# Patient Record
Sex: Male | Born: 1935 | Race: White | Hispanic: No | Marital: Married | Smoking: Former smoker
Health system: Southern US, Community
[De-identification: ages and names within clinical notes are randomized; demographics above are authoritative.]

## PROBLEM LIST (undated history)

## (undated) DIAGNOSIS — E785 Hyperlipidemia, unspecified: Secondary | ICD-10-CM

## (undated) DIAGNOSIS — IMO0002 Reserved for concepts with insufficient information to code with codable children: Secondary | ICD-10-CM

## (undated) DIAGNOSIS — I2510 Atherosclerotic heart disease of native coronary artery without angina pectoris: Secondary | ICD-10-CM

## (undated) DIAGNOSIS — I10 Essential (primary) hypertension: Secondary | ICD-10-CM

## (undated) HISTORY — PX: REPLACEMENT TOTAL KNEE: SUR1224

## (undated) HISTORY — PX: CARDIAC CATHETERIZATION: SHX172

## (undated) HISTORY — DX: Reserved for concepts with insufficient information to code with codable children: IMO0002

## (undated) HISTORY — DX: Essential (primary) hypertension: I10

---

## 2012-08-11 ENCOUNTER — Ambulatory Visit: Payer: Self-pay | Admitting: General Practice

## 2012-08-11 LAB — BASIC METABOLIC PANEL
Anion Gap: 7 (ref 7–16)
BUN: 8 mg/dL (ref 7–18)
Calcium, Total: 8.9 mg/dL (ref 8.5–10.1)
Chloride: 102 mmol/L (ref 98–107)
Co2: 30 mmol/L (ref 21–32)
Creatinine: 0.99 mg/dL (ref 0.60–1.30)
EGFR (African American): >60
Glucose: 104 mg/dL — ABNORMAL HIGH (ref 65–99)
Osmolality: 276 (ref 275–301)

## 2012-08-11 LAB — URINALYSIS, COMPLETE
Bilirubin,UR: NEGATIVE
Blood: NEGATIVE
Ketone: NEGATIVE
Leukocyte Esterase: NEGATIVE
Nitrite: NEGATIVE
RBC,UR: 1 /HPF (ref 0–5)
Specific Gravity: 1.019 (ref 1.003–1.030)
Squamous Epithelial: NONE SEEN

## 2012-08-11 LAB — CBC
MCH: 30.4 pg (ref 26.0–34.0)
MCHC: 34.7 g/dL (ref 32.0–36.0)
Platelet: 232 10*3/uL (ref 150–440)
WBC: 10.7 10*3/uL — ABNORMAL HIGH (ref 3.8–10.6)

## 2012-08-11 LAB — MRSA PCR SCREENING

## 2012-08-11 LAB — PROTIME-INR: Prothrombin Time: 13.9 secs (ref 11.5–14.7)

## 2012-08-12 LAB — URINE CULTURE

## 2012-08-25 ENCOUNTER — Inpatient Hospital Stay: Payer: Self-pay | Admitting: General Practice

## 2012-08-26 LAB — BASIC METABOLIC PANEL
Calcium, Total: 8.1 mg/dL — ABNORMAL LOW (ref 8.5–10.1)
Chloride: 100 mmol/L (ref 98–107)
Co2: 28 mmol/L (ref 21–32)
EGFR (African American): >60
Glucose: 115 mg/dL — ABNORMAL HIGH (ref 65–99)
Potassium: 3.2 mmol/L — ABNORMAL LOW (ref 3.5–5.1)
Sodium: 134 mmol/L — ABNORMAL LOW (ref 136–145)

## 2012-08-26 LAB — PLATELET COUNT: Platelet: 183 10*3/uL (ref 150–440)

## 2012-08-26 LAB — HEMOGLOBIN: HGB: 13.8 g/dL (ref 13.0–18.0)

## 2012-08-27 LAB — HEMOGLOBIN: HGB: 12 g/dL — ABNORMAL LOW (ref 13.0–18.0)

## 2012-08-27 LAB — BASIC METABOLIC PANEL
Anion Gap: 3 — ABNORMAL LOW (ref 7–16)
Calcium, Total: 7.8 mg/dL — ABNORMAL LOW (ref 8.5–10.1)
Chloride: 103 mmol/L (ref 98–107)
Creatinine: 0.89 mg/dL (ref 0.60–1.30)
EGFR (African American): >60
EGFR (Non-African Amer.): >60
Osmolality: 271 (ref 275–301)
Potassium: 4 mmol/L (ref 3.5–5.1)
Sodium: 136 mmol/L (ref 136–145)

## 2012-08-27 LAB — PLATELET COUNT: Platelet: 156 10*3/uL (ref 150–440)

## 2012-09-22 ENCOUNTER — Ambulatory Visit: Payer: Self-pay | Admitting: General Practice

## 2013-07-18 ENCOUNTER — Encounter (HOSPITAL_COMMUNITY): Payer: Self-pay | Admitting: Cardiology

## 2013-07-18 ENCOUNTER — Encounter (HOSPITAL_COMMUNITY)
Admission: EM | Disposition: A | Discharge status: Home / Self Care | Payer: Self-pay | Attending: Cardiovascular Disease

## 2013-07-18 ENCOUNTER — Emergency Department: Payer: Self-pay | Admitting: Emergency Medicine

## 2013-07-18 ENCOUNTER — Inpatient Hospital Stay (HOSPITAL_COMMUNITY)
Admission: EM | Admit: 2013-07-18 | Discharge: 2013-07-20 | DRG: 247 | Disposition: A | Discharge status: Home / Self Care | Payer: Medicare Other | Source: Intra-hospital | Attending: Cardiovascular Disease | Admitting: Cardiovascular Disease

## 2013-07-18 ENCOUNTER — Inpatient Hospital Stay (HOSPITAL_COMMUNITY): Payer: Medicare Other

## 2013-07-18 ENCOUNTER — Ambulatory Visit (HOSPITAL_COMMUNITY)
Admission: EM | Admit: 2013-07-18 | Discharge: 2013-07-18 | Disposition: A | Discharge status: Home / Self Care | Payer: Medicare Other | Attending: Emergency Medicine | Admitting: Emergency Medicine

## 2013-07-18 DIAGNOSIS — I2510 Atherosclerotic heart disease of native coronary artery without angina pectoris: Secondary | ICD-10-CM

## 2013-07-18 DIAGNOSIS — I2119 ST elevation (STEMI) myocardial infarction involving other coronary artery of inferior wall: Secondary | ICD-10-CM

## 2013-07-18 DIAGNOSIS — Z955 Presence of coronary angioplasty implant and graft: Secondary | ICD-10-CM

## 2013-07-18 DIAGNOSIS — R319 Hematuria, unspecified: Secondary | ICD-10-CM | POA: Diagnosis present

## 2013-07-18 DIAGNOSIS — I10 Essential (primary) hypertension: Secondary | ICD-10-CM | POA: Diagnosis present

## 2013-07-18 DIAGNOSIS — E785 Hyperlipidemia, unspecified: Secondary | ICD-10-CM

## 2013-07-18 DIAGNOSIS — Z7982 Long term (current) use of aspirin: Secondary | ICD-10-CM

## 2013-07-18 DIAGNOSIS — R7309 Other abnormal glucose: Secondary | ICD-10-CM | POA: Diagnosis present

## 2013-07-18 DIAGNOSIS — Z87891 Personal history of nicotine dependence: Secondary | ICD-10-CM

## 2013-07-18 DIAGNOSIS — Z9861 Coronary angioplasty status: Secondary | ICD-10-CM

## 2013-07-18 DIAGNOSIS — I359 Nonrheumatic aortic valve disorder, unspecified: Secondary | ICD-10-CM

## 2013-07-18 HISTORY — DX: Atherosclerotic heart disease of native coronary artery without angina pectoris: I25.10

## 2013-07-18 HISTORY — DX: Essential (primary) hypertension: I10

## 2013-07-18 HISTORY — DX: Hyperlipidemia, unspecified: E78.5

## 2013-07-18 HISTORY — PX: LEFT HEART CATHETERIZATION WITH CORONARY ANGIOGRAM: SHX5451

## 2013-07-18 LAB — COMPREHENSIVE METABOLIC PANEL
ALT: 28 U/L (ref 0–53)
AST: 48 U/L — ABNORMAL HIGH (ref 0–37)
Albumin: 3.7 g/dL (ref 3.5–5.2)
Alkaline Phosphatase: 79 U/L (ref 39–117)
BUN: 7 mg/dL (ref 6–23)
CALCIUM: 9 mg/dL (ref 8.4–10.5)
CO2: 21 mEq/L (ref 19–32)
Chloride: 98 mEq/L (ref 96–112)
Creatinine, Ser: 0.73 mg/dL (ref 0.50–1.35)
GFR, EST NON AFRICAN AMERICAN: 89 mL/min — AB (ref >90)
GLUCOSE: 120 mg/dL — AB (ref 70–99)
Potassium: 3.8 mEq/L (ref 3.7–5.3)
Sodium: 138 mEq/L (ref 137–147)
Total Bilirubin: 0.7 mg/dL (ref 0.3–1.2)
Total Protein: 7.3 g/dL (ref 6.0–8.3)

## 2013-07-18 LAB — CBC
HCT: 51.5 % (ref 40.0–52.0)
HGB: 17 g/dL (ref 13.0–18.0)
MCH: 30.3 pg (ref 26.0–34.0)
MCHC: 33 g/dL (ref 32.0–36.0)
MCV: 92 fL (ref 80–100)
Platelet: 202 10*3/uL (ref 150–440)
RBC: 5.62 10*6/uL (ref 4.40–5.90)
RDW: 15 % — AB (ref 11.5–14.5)
WBC: 11 10*3/uL — ABNORMAL HIGH (ref 3.8–10.6)

## 2013-07-18 LAB — BASIC METABOLIC PANEL
ANION GAP: 7 (ref 7–16)
BUN: 8 mg/dL (ref 7–18)
CREATININE: 0.88 mg/dL (ref 0.60–1.30)
Calcium, Total: 8.9 mg/dL (ref 8.5–10.1)
Chloride: 101 mmol/L (ref 98–107)
Co2: 30 mmol/L (ref 21–32)
EGFR (African American): >60
EGFR (Non-African Amer.): >60
GLUCOSE: 113 mg/dL — AB (ref 65–99)
Osmolality: 275 (ref 275–301)
Potassium: 3.6 mmol/L (ref 3.5–5.1)
Sodium: 138 mmol/L (ref 136–145)

## 2013-07-18 LAB — HEMOGLOBIN A1C
HEMOGLOBIN A1C: 6.1 % — AB (ref <5.7)
MEAN PLASMA GLUCOSE: 128 mg/dL — AB (ref <117)

## 2013-07-18 LAB — CBC WITH DIFFERENTIAL/PLATELET
Basophils Absolute: 0.1 10*3/uL (ref 0.0–0.1)
Basophils Relative: 1 % (ref 0–1)
Eosinophils Absolute: 0.2 10*3/uL (ref 0.0–0.7)
Eosinophils Relative: 3 % (ref 0–5)
HEMATOCRIT: 45.7 % (ref 39.0–52.0)
HEMOGLOBIN: 15.9 g/dL (ref 13.0–17.0)
LYMPHS ABS: 1 10*3/uL (ref 0.7–4.0)
LYMPHS PCT: 12 % (ref 12–46)
MCH: 30.9 pg (ref 26.0–34.0)
MCHC: 34.8 g/dL (ref 30.0–36.0)
MCV: 88.7 fL (ref 78.0–100.0)
MONO ABS: 0.7 10*3/uL (ref 0.1–1.0)
MONOS PCT: 8 % (ref 3–12)
NEUTROS ABS: 6.7 10*3/uL (ref 1.7–7.7)
NEUTROS PCT: 76 % (ref 43–77)
Platelets: 178 10*3/uL (ref 150–400)
RBC: 5.15 MIL/uL (ref 4.22–5.81)
RDW: 14.2 % (ref 11.5–15.5)
WBC: 8.8 10*3/uL (ref 4.0–10.5)

## 2013-07-18 LAB — TROPONIN I
TROPONIN I: 1.51 ng/mL — AB (ref <0.30)
TROPONIN-I: 0.03 ng/mL
Troponin I: 6.54 ng/mL (ref <0.30)

## 2013-07-18 LAB — MRSA PCR SCREENING: MRSA BY PCR: NEGATIVE

## 2013-07-18 LAB — APTT: ACTIVATED PTT: 34 s (ref 23.6–35.9)

## 2013-07-18 LAB — TSH: TSH: 2.55 u[IU]/mL (ref 0.350–4.500)

## 2013-07-18 LAB — MAGNESIUM: Magnesium: 2 mg/dL (ref 1.5–2.5)

## 2013-07-18 LAB — PRO B NATRIURETIC PEPTIDE: PRO B NATRI PEPTIDE: 104.2 pg/mL (ref 0–450)

## 2013-07-18 SURGERY — LEFT HEART CATHETERIZATION WITH CORONARY ANGIOGRAM
Anesthesia: LOCAL

## 2013-07-18 MED ORDER — TRAMADOL HCL 50 MG PO TABS: 50.0000 mg | ORAL_TABLET | ORAL | Status: DC | PRN

## 2013-07-18 MED ORDER — PANTOPRAZOLE SODIUM 40 MG PO TBEC
40.0000 mg | DELAYED_RELEASE_TABLET | Freq: Two times a day (BID) | ORAL | Status: DC
  Administered 2013-07-18 – 2013-07-20 (×4): 40 mg via ORAL
  Filled 2013-07-18 (×4): qty 1

## 2013-07-18 MED ORDER — SODIUM CHLORIDE 0.9 % IV SOLN: INTRAVENOUS | Status: AC

## 2013-07-18 MED ORDER — METOPROLOL TARTRATE 25 MG PO TABS
25.0000 mg | ORAL_TABLET | Freq: Two times a day (BID) | ORAL | Status: DC
  Administered 2013-07-18 – 2013-07-20 (×5): 25 mg via ORAL
  Filled 2013-07-18 (×7): qty 1

## 2013-07-18 MED ORDER — ANGIOPLASTY BOOK
Freq: Once | Status: AC
  Administered 2013-07-18: 11:00:00
  Filled 2013-07-18: qty 1

## 2013-07-18 MED ORDER — HEART ATTACK BOUNCING BOOK
Freq: Once | Status: AC
  Administered 2013-07-18: 11:00:00
  Filled 2013-07-18: qty 1

## 2013-07-18 MED ORDER — ACTIVE PARTNERSHIP FOR HEALTH OF YOUR HEART BOOK
Freq: Once | Status: AC
  Administered 2013-07-18: 11:00:00
  Filled 2013-07-18: qty 1

## 2013-07-18 MED ORDER — OXYCODONE HCL 5 MG PO CAPS: 5.0000 mg | ORAL_CAPSULE | ORAL | Status: DC | PRN

## 2013-07-18 MED ORDER — TICAGRELOR 90 MG PO TABS
90.0000 mg | ORAL_TABLET | Freq: Two times a day (BID) | ORAL | Status: DC
  Administered 2013-07-18 – 2013-07-20 (×4): 90 mg via ORAL
  Filled 2013-07-18 (×6): qty 1

## 2013-07-18 MED ORDER — ASPIRIN 81 MG PO CHEW
81.0000 mg | CHEWABLE_TABLET | Freq: Every day | ORAL | Status: DC
  Administered 2013-07-19: 81 mg via ORAL
  Filled 2013-07-18: qty 1

## 2013-07-18 MED ORDER — OXYCODONE HCL 5 MG PO TABS: 5.0000 mg | ORAL_TABLET | ORAL | Status: DC | PRN

## 2013-07-18 MED ORDER — BIOTENE DRY MOUTH MT LIQD
15.0000 mL | Freq: Two times a day (BID) | OROMUCOSAL | Status: DC
  Administered 2013-07-18 – 2013-07-19 (×3): 15 mL via OROMUCOSAL

## 2013-07-18 MED ORDER — ATORVASTATIN CALCIUM 80 MG PO TABS
80.0000 mg | ORAL_TABLET | Freq: Every day | ORAL | Status: DC
  Filled 2013-07-18: qty 1

## 2013-07-18 MED ORDER — ROSUVASTATIN CALCIUM 20 MG PO TABS
20.0000 mg | ORAL_TABLET | Freq: Every day | ORAL | Status: DC
  Administered 2013-07-18 – 2013-07-19 (×2): 20 mg via ORAL
  Filled 2013-07-18 (×3): qty 1

## 2013-07-18 NOTE — Progress Notes (Addendum)
Conferred with primary nurse.  Hold on ambulation due to wrist device in place.  Awaiting second troponin level. Oriented pt to Cardiac Rehab. Will follow up on Monday. Alanson Alyarlette Carlton RN, BSN

## 2013-07-18 NOTE — Progress Notes (Signed)
Echocardiogram 2D Echocardiogram has been performed.  Glenn Wilson, Glenn Wilson 07/18/2013, 2:20 PM

## 2013-07-18 NOTE — CV Procedure (Signed)
Cardiac Catheterization Procedure Note  Name: Glenn Wilson MRN: [id number] DOB: 09/26/1935  Procedure: Selective Coronary Angiography,  PTCA and drug-eluting stenting of the proximal and midright coronary artery  Indication: Inferior ST elevation myocardial infarction.  Medications:  Sedation:  0 mg IV Versed, 0 mcg IV Fentanyl  Contrast:  145 ML Omnipaque  Procedural Details: The right wrist was prepped, draped, and anesthetized with 1% lidocaine. Using the modified Seldinger technique, a 5 French Slender sheath was introduced into the right radial artery. 3 mg of verapamil was administered through the sheath. A Jackie catheter was used but could not engage the coronary arteries due to extensive tortuosity in the right innominate artery which may torquing the catheter was extremely difficult. I used a JL 3.5 catheter but was not successful in engaging the left main coronary artery. I used a TIG catheter and was able to engage the left main coronary artery with this. Right coronary angiography was performed with a JR 4 guiding catheter. Although I was able to cross the aortic valve with the wire, I was not able to advance the pigtail catheter due to tortuosity and thus left ventricular pressure of and left ventricular angiography could not be performed. Catheter exchanges were performed over an exchange length guidewire. There were no immediate procedural complications.  Procedural Findings:  Hemodynamics: AO:   141/79   mmHg  Coronary angiography: Coronary dominance: Right   Left Main:  Mildly calcified with no significant disease.  Left Anterior Descending (LAD):  Normal in size and mildly calcified. The vessel has minor irregularities in the proximal and midsegment. Distally: There is 60-70 he % tubular stenosis.  1st diagonal (D1):  Normal in size with 60% mid stenosis.  2nd diagonal (D2):  Normal in size with 50% proximal stenosis.  3rd diagonal (D3):  Very small in  size.  Circumflex (LCx):  Normal in size and nondominant. There is diffuse 30% disease proximally. There is 50% tubular stenosis in the midsegment.  1st obtuse marginal:  Small in size with minor irregularities.  2nd obtuse marginal:  Large in size with 80-90% proximal stenosis.  3rd obtuse marginal:  Medium in size with minor irregularities.   Right Coronary Artery: Large in size and dominant. There is diffuse 70% disease proximally. There is 99% subtotal occlusion in the mid segment with TIMI 2 flow which is the culprit for inferior ST elevation myocardial infarction. There is moderate ectasia in the distal segment. The rest of the vessel has minor irregularities.  Posterior descending artery: Normal in size with minor irregularities.  Posterior AV segment: Normal in size with diffuse 20% disease.   Left ventriculography: Was not performed. The aortic valve could not be crossed.  PCI Note:  Following the diagnostic procedure, the decision was made to proceed with PCI.  Weight-based bivalirudin was given for anticoagulation. Once a therapeutic ACT was achieved, a 6 [place] JR 4 guide catheter was inserted.  A Runthrough coronary guidewire was used to cross the lesion.  The lesion was predilated with a 2.5 x 12 balloon.  The lesion was then stented with a 3.5 X 33 mm Xience drug eluting stent . This also cover the proximal stenosis. The stent was postdilated with a  4.0 x 12 mm  noncompliant balloon to 14 atmospheres .  Following PCI, there was 0% residual stenosis and TIMI-3 flow. Final angiography confirmed an excellent result. The patient tolerated the procedure well. There were no immediate procedural complications. A TR band was used for radial  hemostasis. The patient was transferred to the post catheterization recovery area for further monitoring.  PCI Data: Vessel -   right coronary artery /Segment - mid to proximal and  Percent Stenosis (pre)   99%  TIMI-flow : 2 Stent : 3.5 X 33 mm  Xience drug eluting stent postdilated with a 4.0 noncompliant balloon Percent Stenosis (post)  0%  TIMI-flow (post)  3    Final Conclusions:   1. Significant three-vessel coronary artery disease with the culprit for inferior ST elevation myocardial infarction being subtotal thrombotic occlusion of the midright coronary artery.  There is 60-70% distal LAD stenosis and 80-90% proximal stenosis in a large OM 2.   2. Successful angioplasty and drug-eluting stent placement to the mid right coronary artery.  Recommendations:   obtain an echocardiogram to evaluate LV systolic function. This procedure was very difficult via the right radial artery due to extreme tortuosity in the right innominate artery. Recommend left radial access or  femoral access for future catheterizations.   dual antiplatelet therapy is recommended for at least one year. The patient might be a candidate for the complete research study for management of his residual coronary artery disease. I discussed this with him and he is willing to participate.  Lorine BearsMuhammad Arida MD, [facility]ACC 07/18/2013, 10:10 AM

## 2013-07-18 NOTE — H&P (Signed)
Glenn Wilson is an 78 y.o. male.    Primary Cardiologist:Dr. Kirke CorinArida NEW PCP:Dr. Kenton KingfisherBarbara Allridge  Chief Complaint: Chest pain HPI:  78 year old MM [place] cardiac [place] and found to have non critical disease.  Today after waking he developed mid sternal chest pain (like indigestion)  with radiation to his left shoulder and jaw.  + for diaphoresis, mild nausea, mild SOB. He went to  and had inf. ST elevation on his EKG.  [facility]  Emergent cath was done by Dr. Kirke CorinArida.      Past Medical History  Diagnosis Date  . CAD, multiple vessel, LAD 07/18/2013  . HTN (hypertension) 07/18/2013  . Hyperlipidemia LDL goal <70 07/18/2013    Past Surgical History  Procedure Laterality Date  . Cardiac catheterization    . Replacement total knee      rt    Family History  Problem Relation Age of Onset  . Diabetes type II Father    Social History:  reports that he quit smoking about 53 years ago. He has never used smokeless tobacco. He reports that he does not drink alcohol or use illicit drugs. 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 Lipitor [Atorvastatin]     Muscle aches severe     Medications Prior to Admission  Medication Sig Dispense Refill  . amLODipine (NORVASC) 10 MG tablet Take 10 mg by mouth daily.      . celecoxib (CELEBREX) 200 MG capsule Take 200 mg by mouth 2 (two) times daily.      . metoprolol succinate (TOPROL-XL) 50 MG 24 hr tablet Take 50 mg by mouth daily. Take with or immediately following a meal.      . naproxen sodium (ANAPROX) 220 MG tablet Take 220 mg by mouth every 8 (eight) hours as needed.      Marland Kitchen. oxycodone (OXY-IR) 5 MG capsule Take 5 mg by mouth every 4 (four) hours as needed for pain.      . pantoprazole (PROTONIX) 40 MG tablet Take 40 mg by mouth 2 (two) times daily.      . traMADol (ULTRAM) 50 MG tablet Take 50 mg by mouth every 4 (four) hours  as needed for moderate pain.       Please not pt is no on celebrex, or protonix as outpt.  Not on anaprox. He is on allporinol 300 mg daily ASA 81 mg daily, meloxicam 7.4 mg daily and 10 meq of potassium daily.  Not on oxycodone,protonix, or ultram.   No results found for this or any previous visit (from the past 48 hour(s)). No results found.  ROS: General:no colds or fevers, no weight changes Skin:no rashes or ulcers HEENT:no blurred vision, no congestion CV:see HPI PUL:see HPI GI:no diarrhea constipation or melena, no indigestion GU:no hematuria, no dysuria MS:no joint pain, no claudication Neuro:no syncope, no lightheadedness Endo:no diabetes, no thyroid disease   Blood pressure 155/75, pulse 75, resp. rate 15, height 5\' 8"  (1.727 m), weight 198 lb 13.7 oz (90.2 kg), SpO2 99.00%. PE: General:Pleasant affect, NAD Skin:Warm and dry, brisk capillary refill HEENT:normocephalic, sclera clear, mucus membranes moist Neck:supple, no JVD, no bruits, no adenopathy no thyromegaly   Heart:S1S2 RRR with soft 1/6 systolic murmur, no gallup, rub or click Lungs:clear with fine rales in the bases, no rhonchi or wheezes ZOX:WRUEAbd:soft,  non tender, + BS, do not palpate liver spleen or masses Ext:no lower ext edema, 2+ pedal pulses, 2+ radial pulses Neuro:alert and oriented X 3, MAE, follows commands, + facial symmetry    Assessment/Plan Principal Problem:   ST elevation myocardial infarction (STEMI) of inferior wall-s/p stent DES Xience to mid RCA usual antiplatelet for 1 year at least Active Problems:   CAD, multiple vessel, LAD- 60-70% distal LAD and 80-90% prox on large OM2.- may be candidate for research study- pt agrees to participate.   HTN (hypertension)   Hyperlipidemia LDL goal <70- pt intolerant to Lipitor with severe muscle pain    Could not cross aortic valve- will order echo to eval LV.   [facility] LPNGOLD,LAURA R Nurse Practitioner Certified [facility] Pager [phone]  or after 5pm or weekends call [phone] 07/18/2013, 11:01 AM  History and all data above reviewed.  Patient examined.  I agree with the findings as above.  He had the sudden onset of pain this AM.  He had not been having this otherwise.  He denies any current pain post PCI.  He is now having hematuria.  He was not having this previously.  He denies any other urologic symptoms.  The patient exam reveals COR:RRR  ,  Lungs: clear  ,  Abd: Positive bowel sounds, no rebound no guarding, Ext Right wrist stable post cath.  .  All available labs, radiology testing, previous records reviewed. Agree with documented assessment and plan.   CAD:  PCI/Stent RCA.  He has residual disease as documented.  The plan for now is medical management with PCI if he has recurrent symptoms.  (Of note [facility]).  Hematuria:  This is status post bival and other meds as listed.  Need to follow this tonight to see if this improves.  Might need urology consult if continues.  I would suggest this as an outpatient at the minimum.  He has no attempts at Foley today.  He has had no recent bleeding or pain.  No prior history of nephrolithiasis or other.    Glenn Wilson  11:39 AM  07/18/2013

## 2013-07-18 NOTE — Progress Notes (Deleted)
ANGIOMAX INFUSED.  OFF NOW.

## 2013-07-19 DIAGNOSIS — I10 Essential (primary) hypertension: Secondary | ICD-10-CM

## 2013-07-19 DIAGNOSIS — I2510 Atherosclerotic heart disease of native coronary artery without angina pectoris: Secondary | ICD-10-CM

## 2013-07-19 DIAGNOSIS — E785 Hyperlipidemia, unspecified: Secondary | ICD-10-CM

## 2013-07-19 LAB — BASIC METABOLIC PANEL
BUN: 7 mg/dL (ref 6–23)
CHLORIDE: 98 meq/L (ref 96–112)
CO2: 25 meq/L (ref 19–32)
CREATININE: 0.75 mg/dL (ref 0.50–1.35)
Calcium: 8.7 mg/dL (ref 8.4–10.5)
GFR calc Af Amer: >90 mL/min (ref >90)
GFR calc non Af Amer: 88 mL/min — ABNORMAL LOW (ref >90)
GLUCOSE: 101 mg/dL — AB (ref 70–99)
POTASSIUM: 3.2 meq/L — AB (ref 3.7–5.3)
Sodium: 139 mEq/L (ref 137–147)

## 2013-07-19 LAB — LIPID PANEL
CHOL/HDL RATIO: 6.9 ratio
CHOLESTEROL: 193 mg/dL (ref 0–200)
HDL: 28 mg/dL — ABNORMAL LOW (ref >39)
LDL CALC: 109 mg/dL — AB (ref 0–99)
TRIGLYCERIDES: 282 mg/dL — AB (ref <150)
VLDL: 56 mg/dL — AB (ref 0–40)

## 2013-07-19 LAB — CBC
HEMATOCRIT: 45.3 % (ref 39.0–52.0)
Hemoglobin: 15.5 g/dL (ref 13.0–17.0)
MCH: 30.8 pg (ref 26.0–34.0)
MCHC: 34.2 g/dL (ref 30.0–36.0)
MCV: 90.1 fL (ref 78.0–100.0)
Platelets: 173 10*3/uL (ref 150–400)
RBC: 5.03 MIL/uL (ref 4.22–5.81)
RDW: 14.6 % (ref 11.5–15.5)
WBC: 9.3 10*3/uL (ref 4.0–10.5)

## 2013-07-19 LAB — TROPONIN I: TROPONIN I: 5.17 ng/mL — AB (ref <0.30)

## 2013-07-19 MED ORDER — POTASSIUM CHLORIDE CRYS ER 20 MEQ PO TBCR
20.0000 meq | EXTENDED_RELEASE_TABLET | Freq: Every day | ORAL | Status: DC
  Administered 2013-07-19 – 2013-07-20 (×2): 20 meq via ORAL
  Filled 2013-07-19 (×2): qty 1

## 2013-07-19 MED ORDER — ALLOPURINOL 300 MG PO TABS
300.0000 mg | ORAL_TABLET | Freq: Every day | ORAL | Status: DC
  Administered 2013-07-19 – 2013-07-20 (×2): 300 mg via ORAL
  Filled 2013-07-19 (×2): qty 1

## 2013-07-19 MED ORDER — POTASSIUM CHLORIDE CRYS ER 20 MEQ PO TBCR
40.0000 meq | EXTENDED_RELEASE_TABLET | Freq: Once | ORAL | Status: AC
  Administered 2013-07-19: 40 meq via ORAL
  Filled 2013-07-19: qty 2

## 2013-07-19 MED ORDER — AMLODIPINE BESYLATE 5 MG PO TABS
5.0000 mg | ORAL_TABLET | Freq: Every day | ORAL | Status: DC
  Administered 2013-07-19 – 2013-07-20 (×2): 5 mg via ORAL
  Filled 2013-07-19 (×2): qty 1

## 2013-07-19 NOTE — Progress Notes (Signed)
TELEMETRY: Reviewed telemetry pt in NSR: Filed Vitals:   07/19/13 0505 07/19/13 0600 07/19/13 0700 07/19/13 0800  BP: 149/84 141/96 141/114   Pulse:  71 89   Temp:    98.6 F (37 C)  TempSrc:    Oral  Resp:  21 30   Height:      Weight:      SpO2:  97% 99%     Intake/Output Summary (Last 24 hours) at 07/19/13 0914 Last data filed at 07/19/13 0800  Gross per 24 hour  Intake   1940 ml  Output   2950 ml  Net  -1010 ml   Filed Weights   07/18/13 1033 07/19/13 0500  Weight: 198 lb 13.7 oz (90.2 kg) 190 lb 7.6 oz (86.4 kg)    Subjective Feels great. No chest pain. No SOB.  Marland Kitchen. antiseptic oral rinse  15 mL Mouth Rinse BID  . aspirin  81 mg Oral Daily  . metoprolol tartrate  25 mg Oral BID  . pantoprazole  40 mg Oral BID  . rosuvastatin  20 mg Oral q1800  . ticagrelor  90 mg Oral BID      LABS: Basic Metabolic Panel:  Recent Labs  19/14/7806/20/15 1139 07/19/13 0215  NA 138 139  K 3.8 3.2*  CL 98 98  CO2 21 25  GLUCOSE 120* 101*  BUN 7 7  CREATININE 0.73 0.75  CALCIUM 9.0 8.7  MG 2.0  --    Liver Function Tests:  Recent Labs  07/18/13 1139  AST 48*  ALT 28  ALKPHOS 79  BILITOT 0.7  PROT 7.3  ALBUMIN 3.7   No results found for this basename: LIPASE, AMYLASE,  in the last 72 hours CBC:  Recent Labs  07/18/13 1139 07/19/13 0215  WBC 8.8 9.3  NEUTROABS 6.7  --   HGB 15.9 15.5  HCT 45.7 45.3  MCV 88.7 90.1  PLT 178 173   Cardiac Enzymes:  Recent Labs  07/18/13 1139 07/18/13 1645 07/18/13 2326  TROPONINI 1.51* 6.54* 5.17*   BNP:  Recent Labs  07/18/13 1139  PROBNP 104.2   D-Dimer: No results found for this basename: DDIMER,  in the last 72 hours Hemoglobin A1C:  Recent Labs  07/18/13 1139  HGBA1C 6.1*   Fasting Lipid Panel:  Recent Labs  07/19/13 0215  CHOL 193  HDL 28*  LDLCALC 109*  TRIG 282*  CHOLHDL 6.9   Thyroid Function Tests:  Recent Labs  07/18/13 1139  TSH 2.550     Radiology/Studies:  Dg Chest Port 1  View  07/18/2013   CLINICAL DATA:  STEMI  EXAM: PORTABLE CHEST - 1 VIEW  COMPARISON:  Portable chest radiograph 07/18/2013  FINDINGS: Low lung volumes. Cardiac silhouette and mediastinal contours are stable. Lungs are clear. Mild degenerative changes within the right shoulder. No acute osseous abnormalities.  IMPRESSION: Low lung volumes.  No acute cardiopulmonary disease.   Electronically Signed   By: Salome HolmesHector  Cooper M.D.   On: 07/18/2013 11:33   Ecg: NSR with inferior Q waves. ST elevation resolved.  Echo: Study Conclusions  - Left ventricle: The cavity size was normal. Wall thickness was normal. Systolic function was normal. The estimated ejection fraction was in the range of 60% to 65%. Doppler parameters are consistent with abnormal left ventricular relaxation (grade 1 diastolic dysfunction). - Aortic valve: There was mild regurgitation. Valve area (VTI): 2.11 cm^2. Valve area (Vmax): 2.03 cm^2. - Mitral valve: Calcified annulus. - Pericardium, extracardiac: A trivial pericardial  effusion was identified posterior to the heart.   PHYSICAL EXAM General: Well developed, well nourished, in no acute distress. Head: Normocephalic, atraumatic, sclera non-icteric, oropharynx is clear Neck: Negative for carotid bruits. JVD not elevated. No adenopathy Lungs: Clear bilaterally to auscultation without wheezes, rales, or rhonchi. Breathing is unlabored. Heart: RRR S1 S2 without murmurs, rubs, or gallops.  Abdomen: Soft, non-tender, non-distended with normoactive bowel sounds. No hepatomegaly. No rebound/guarding. No obvious abdominal masses. Msk:  Strength and tone appears normal for age. Extremities: No clubbing, cyanosis or edema.  Distal pedal pulses are 2+ and equal bilaterally. Neuro: Alert and oriented X 3. Moves all extremities spontaneously. Psych:  Responds to questions appropriately with a normal affect.  ASSESSMENT AND PLAN: 1. Inferior STEMI. S/p DES proximal-mid RCA. No recurrent  chest pain or arrhythmia. Normal EF. On DAPT with ASA and Brilinta. Continue at least one year. Residual disease noted in OM and distal LAD. May be a candidate for Complete trial. Patient is agreeable to consider this. Will need to contact study coordinator in am. Will transfer to telemetry today. Anticipate DC in am. Continue statin and beta blocker therapy.  2. Hyperlipidemia. On high dose statin.  3. HTN on metoprolol. Resume norvasc which he was on before.    Present on Admission:  . ST elevation myocardial infarction (STEMI) of inferior wall . CAD, multiple vessel, LAD . HTN (hypertension) . Hyperlipidemia LDL goal <70  Signed, Peter Swaz[place]Jordan, MDFACC 07/19/2013 9:14 AM

## 2013-07-20 ENCOUNTER — Telehealth: Payer: Self-pay

## 2013-07-20 LAB — POCT I-STAT, CHEM 8
BUN: 6 mg/dL (ref 6–23)
CALCIUM ION: 1.11 mmol/L — AB (ref 1.13–1.30)
Chloride: 101 mEq/L (ref 96–112)
Creatinine, Ser: 0.8 mg/dL (ref 0.50–1.35)
Glucose, Bld: 133 mg/dL — ABNORMAL HIGH (ref 70–99)
HEMATOCRIT: 48 % (ref 39.0–52.0)
HEMOGLOBIN: 16.3 g/dL (ref 13.0–17.0)
Potassium: 3.3 mEq/L — ABNORMAL LOW (ref 3.7–5.3)
Sodium: 141 mEq/L (ref 137–147)
TCO2: 23 mmol/L (ref 0–100)

## 2013-07-20 LAB — POCT ACTIVATED CLOTTING TIME: Activated Clotting Time: 692 seconds

## 2013-07-20 MED ORDER — AMLODIPINE BESYLATE 5 MG PO TABS
5.0000 mg | ORAL_TABLET | Freq: Every morning | ORAL | Status: DC
Start: 2013-07-20 — End: 2013-08-13

## 2013-07-20 MED ORDER — TICAGRELOR 90 MG PO TABS: 90.0000 mg | ORAL_TABLET | Freq: Two times a day (BID) | ORAL | Status: DC

## 2013-07-20 MED ORDER — ROSUVASTATIN CALCIUM 20 MG PO TABS
20.0000 mg | ORAL_TABLET | Freq: Every day | ORAL | Status: DC
Start: 2013-07-20 — End: 2013-08-13

## 2013-07-20 MED ORDER — NITROGLYCERIN 0.4 MG SL SUBL: 0.4000 mg | SUBLINGUAL_TABLET | SUBLINGUAL | Status: DC | PRN

## 2013-07-20 MED ORDER — METOPROLOL TARTRATE 25 MG PO TABS: 25.0000 mg | ORAL_TABLET | Freq: Two times a day (BID) | ORAL | Status: DC

## 2013-07-20 MED FILL — Sodium Chloride IV Soln 0.9%: INTRAVENOUS | Qty: 50 | Status: AC

## 2013-07-20 MED FILL — Nitroglycerin IV Soln 200 MCG/ML in D5W: INTRAVENOUS | Qty: 1 | Status: AC

## 2013-07-20 MED FILL — Verapamil HCl IV Soln 2.5 MG/ML: INTRAVENOUS | Qty: 2 | Status: AC

## 2013-07-20 MED FILL — Ticagrelor Tab 90 MG: ORAL | Qty: 1 | Status: AC

## 2013-07-20 MED FILL — Lidocaine HCl Local Preservative Free (PF) Inj 1%: INTRAMUSCULAR | Qty: 30 | Status: AC

## 2013-07-20 MED FILL — Bivalirudin Trifluoroacetate For IV Soln 250 MG (Base Equiv): INTRAVENOUS | Qty: 250 | Status: AC

## 2013-07-20 MED FILL — Heparin Sodium (Porcine) 2 Unit/ML in Sodium Chloride 0.9%: INTRAMUSCULAR | Qty: 1000 | Status: AC

## 2013-07-20 NOTE — Research (Signed)
Discussed the COMPLETE Research Trial with the patient.  He did not want to participate.  Claire ShownSally Milks, RN, [place]

## 2013-07-20 NOTE — Progress Notes (Signed)
CARDIAC REHAB PHASE I   PRE:  Rate/Rhythm: 105 ST  BP:  Supine:   Sitting: 150/72  Standing:    SaO2: 98%RA  MODE:  Ambulation: 700 ft   POST:  Rate/Rhythm: 106  BP:  Supine:   Sitting: 154/82  Standing:    SaO2: 97%RA 0832-0935 Pt walked 700 ft with steady gait. No CP. Tolerated well. MI education completed with pt who voiced understanding. Able to answer teach back questions. Discussed need to watch carbs with elevated HGA1C. Pt stated he started day out with honey bun every morning. Discussed better food choices. Discussed CRP 2 and pt [facility]. Gave stent and brilinta packets.    Luetta Nuttingharlene Dunlap, RN BSN  07/20/2013 9:31 AM

## 2013-07-20 NOTE — Discharge Summary (Signed)
Physician Discharge Summary  Patient ID: Glenn Wilson MRN: [id number] DOB/AGE: 76/03/1935 76 y.o.  Admit date: 07/18/2013 Discharge date: 07/20/2013  Primary Cardiologist: Dr. Kirke CorinArida  Admission Diagnoses: Inferior Wall STEMI  Discharge Diagnoses:  Principal Problem:   ST elevation myocardial infarction (STEMI) of inferior wall Active Problems:   CAD, multiple vessel, LAD   HTN (hypertension)   Hyperlipidemia LDL goal <70   Discharged Condition: stable  Recent History:   The patient is a 78 year old WM with a h/o HTN and HLD. He had a cardiac cath 6 [place] ago [place] and was found to have non critical disease. [facility] was [facility] for inferior STEMI. He initially presented to [facility] after developing mid sternal chest pain (like indigestion) with radiation to his left shoulder and jaw. He noted associated diaphoresis, mild nausea and mild SOB. His EKG revealed ST elevations in the inferior leads. He was subsequently [facility] for [facility] LHC, performed by Dr. Kirke CorinArida.   Hospital Course:   1. Inferior STEMI/ CAD: He underwent urgent LHC by Dr. Kirke CorinArida on 07/18/13, via the right radial artery, revealing significant three-vessel coronary artery disease with the culprit for inferior ST elevation myocardial infarction being subtotal thrombotic occlusion of the midright coronary artery. This was successfully treated with PCI + DES. He was also noted to have residual disease with 60-70% distal LAD stenosis and 80-90% proximal stenosis in a large OM 2. It was decided to treat medically. LV EF was normal at 60-65%. He left the cath lab in stable condition. He was placed on DAPT with ASA + Brilinta. He was continued on a BB, 25 mg of Lopressor BID and was placed on statin therapy with 20 mg of Crestor (intolerant to Lipitor). He was asked to participate in the "Complete Research Study", however he declined. He had no recurrent CP. He had no difficulty ambulating  with Cardiac Rehab. He had no post cath complications. HR, BP and renal function remained stable. His right radial cath site remained stable. He will need to remain on DAPT for a minimum of 1 month. He was given a free 30 day Brilinta Card. He was also given a prescription for PRN SL NTG. Instructions were provided on the proper use of NTG and when it is appropriate to call 911.    2. HTN: His BP remained controled during his hospitalization. He was continued on a BB, 25  mg of Lopressor BID as well as his home dose of amlodipine, 5 mg daily.   4: HLD: a lipid panel was obtained on 07/19/13 which demonstrated an elevated LDL of 109 mg/dL, low HLD of 28 mg/dL and elevated triglycerides at 282 mg/dL. Given his recent STEMI and residual coronary disease, his goal LDL is <70 mg/dL. He is intolerant to Lipitor (myalgias). He was placed on 20 mg of Crestor daily. He will need a repeat lipid study, as an outpatient, in several months. If not at goal, he may require addition of Zetia.   5: Prediabetes: A Hgb A1c was checked on admission and was 6.1, placing him at increased risk for the development of DM. He was instructed to f/u with his PCP so that this can be monitored yearly. The importance of a heart healthy diet and incorporating mild to moderate daily exercise was discussed.   The patient had no further issues. On Hospital Day 2, he was last seen and examined by Dr. Elease HashimotoNahser, who determined he was stable for discharge  home. He is scheduled for post hospital follow-up with Dr. Kirke CorinArida on 08/03/13 in [place].    Consults: None  Significant Diagnostic Studies:   Left Heart Catheterization 07/18/13 Hemodynamics:  AO: 141/79 mmHg  Coronary angiography:  Coronary dominance: Right  Left Main: Mildly calcified with no significant disease.  Left Anterior Descending (LAD): Normal in size and mildly calcified. The vessel has minor irregularities in the proximal and midsegment. Distally: There is 60-70 he %  tubular stenosis.  1st diagonal (D1): Normal in size with 60% mid stenosis.  2nd diagonal (D2): Normal in size with 50% proximal stenosis.  3rd diagonal (D3): Very small in size.  Circumflex (LCx): Normal in size and nondominant. There is diffuse 30% disease proximally. There is 50% tubular stenosis in the midsegment.  1st obtuse marginal: Small in size with minor irregularities.  2nd obtuse marginal: Large in size with 80-90% proximal stenosis.  3rd obtuse marginal: Medium in size with minor irregularities.  Right Coronary Artery: Large in size and dominant. There is diffuse 70% disease proximally. There is 99% subtotal occlusion in the mid segment with TIMI 2 flow which is the culprit for inferior ST elevation myocardial infarction. There is moderate ectasia in the distal segment. The rest of the vessel has minor irregularities.  Posterior descending artery: Normal in size with minor irregularities.  Posterior AV segment: Normal in size with diffuse 20% disease. Left ventriculography: Was not performed. The aortic valve could not be crossed.   2D echocardiogram 07/19/13 Study Conclusions  - Left ventricle: The cavity size was normal. Wall thickness was normal. Systolic function was normal. The estimated ejection fraction was in the range of 60% to 65%. Doppler parameters are consistent with abnormal left ventricular relaxation (grade 1 diastolic dysfunction). - Aortic valve: There was mild regurgitation. Valve area (VTI): 2.11 cm^2. Valve area (Vmax): 2.03 cm^2. - Mitral valve: Calcified annulus. - Pericardium, extracardiac: A trivial pericardial effusion was identified posterior to the heart.    Lipid Panel 07/19/13    Component Value Date/Time   CHOL 193 07/19/2013 0215   TRIG 282* 07/19/2013 0215   HDL 28* 07/19/2013 0215   CHOLHDL 6.9 07/19/2013 0215   VLDL 56* 07/19/2013 0215   LDLCALC 109* 07/19/2013 0215     Treatments: See Hospital Course  Discharge Exam: Blood pressure  122/78, pulse 96, temperature 98.1 F (36.7 C), temperature source Oral, resp. rate 18, height 5\' 8"  (1.727 m), weight 190 lb 7.6 oz (86.4 kg), SpO2 96.00%.   Disposition: 01-Home or Self Care      Discharge Instructions   Amb Referral to Cardiac Rehabilitation    Complete by:  As directed   Referring to [facility]            Medication List         allopurinol 300 MG tablet  Commonly known as:  ZYLOPRIM  Take 300 mg by mouth daily.     amLODipine 5 MG tablet  Commonly known as:  NORVASC  Take 1 tablet (5 mg total) by mouth every morning.     aspirin EC 81 MG tablet  Take 81 mg by mouth daily.     meloxicam 7.5 MG tablet  Commonly known as:  MOBIC  Take 7.5 mg by mouth daily.     metoprolol succinate 50 MG 24 hr tablet  Commonly known as:  TOPROL-XL  Take 50 mg by mouth every morning. Take with or immediately following a meal.     metoprolol tartrate 25  MG tablet  Commonly known as:  LOPRESSOR  Take 1 tablet (25 mg total) by mouth 2 (two) times daily.     nitroGLYCERIN 0.4 MG SL tablet  Commonly known as:  NITROSTAT  Place 1 tablet (0.4 mg total) under the tongue every 5 (five) minutes as needed for chest pain.     potassium chloride SA 20 MEQ tablet  Commonly known as:  K-DUR,KLOR-CON  Take 20 mEq by mouth daily.     rosuvastatin 20 MG tablet  Commonly known as:  CRESTOR  Take 1 tablet (20 mg total) by mouth daily at 6 PM.     sennosides-docusate sodium 8.6-50 MG tablet  Commonly known as:  SENOKOT-S  Take 1 tablet by mouth daily as needed for constipation.     ticagrelor 90 MG Tabs tablet  Commonly known as:  BRILINTA  Take 1 tablet (90 mg total) by mouth 2 (two) times daily.     ticagrelor 90 MG Tabs tablet  Commonly known as:  BRILINTA  Take 1 tablet (90 mg total) by mouth 2 (two) times daily.       Follow-up Information   Follow up with [name], MD On 08/03/2013. (1:45 pm )    Specialty:  Cardiology   Contact information:    [address] [place] [address] [phone]       TIME SPENT ON DISCHARGE, INCLUDING PHYSICIAN TIME: >30 MINUTES  Signed: Robbie Lis 07/20/2013, 12:28 PM   Attending Note:   The patient was seen and examined.  Agree with assessment and plan as noted above.  Changes made to the above note as needed.  Pt is doing well and is ready to go home. See my note from earlier today.   Vesta Mixer, Montez Hageman., MD, [facility] 07/20/2013, 3:25 PM

## 2013-07-20 NOTE — Progress Notes (Signed)
IV and tele monitor d/c at this time; pt and wife given d/c instructions and prescriptions; both verbalized understanding; will cont. To monitor.

## 2013-07-20 NOTE — Telephone Encounter (Signed)
Patient contacted regarding discharge from [facility] on 07/20/13.  Patient understands to follow up with Dr. Kirke CorinArida on 08/03/13 at 1:45 at [facility]. Patient understands discharge instructions? yes Patient understands medications and regiment? yes Patient understands to bring all medications to this visit? yes

## 2013-07-20 NOTE — Telephone Encounter (Signed)
Opened in error

## 2013-07-20 NOTE — Care Management Note (Signed)
    Page 1 of 1   07/20/2013     11:44:49 AM CARE MANAGEMENT NOTE 07/20/2013  Patient:  Glenn Wilson,Glenn Wilson   Account Number:  [id number]  Date Initiated:  07/20/2013  Documentation initiated by:  AMERSON,JULIE  Subjective/Objective Assessment:   Pt adm on 07/18/13 with NSTEMI.  PTA, pt independent, lives with spouse.     Action/Plan:   Pt listed as self pay, but has [employer] and [employer] benefits.  Pt has 30 day free trial card for Brilinta.  Copy made of insurance cards and faxed to admitting.   Anticipated DC Date:  07/20/2013   Anticipated DC Plan:  HOME/SELF CARE      DC Planning Services  CM consult  Medication Assistance      Choice offered to / List presented to:             Status of service:  Completed, signed off Medicare Important Message given?  NA - LOS <3 / Initial given by admissions (If response is "NO", the following Medicare IM given date fields will be blank) Date Medicare IM given:   Date Additional Medicare IM given:    Discharge Disposition:  HOME/SELF CARE  Per UR Regulation:  Reviewed for med. necessity/level of care/duration of stay  If discussed at Long Length of Stay Meetings, dates discussed:    Comments:

## 2013-07-20 NOTE — Discharge Instructions (Signed)
Try to use Meloxicam sparingly.

## 2013-07-20 NOTE — Progress Notes (Signed)
Patient Profile: 78 y/o male with no prior cardiac history but risk factors including HTN and HLD, admitted 07/18/13 for inferior STEMI. S/p DES to proximal-mid RCA. Also with residual diease in the OM and distal LAD, being treated medically. EF 60-65%.  Subjective: Denies recurrent CP. No SOB. Ambulating w/o difficulty.   Objective: Vital signs in last 24 hours: Temp:  [97.5 F (36.4 C)-98.6 F (37 C)] 98.1 F (36.7 C) (06/22 0433) Pulse Rate:  [65-100] 88 (06/22 0433) Resp:  [14-18] 18 (06/22 0433) BP: (117-158)/(69-94) 137/94 mmHg (06/22 0433) SpO2:  [96 %-99 %] 96 % (06/22 0433) Last BM Date: 07/19/13  Intake/Output from previous day: 06/21 0701 - 06/22 0700 In: 360 [P.O.:360] Out: 1350 [Urine:1350] Intake/Output this shift:    Medications Current Facility-Administered Medications  Medication Dose Route Frequency Aviah Sorci Last Rate Last Dose  . allopurinol (ZYLOPRIM) tablet 300 mg  300 mg Oral Daily Peter M SwazilandJordan, MD   300 mg at 07/19/13 2014  . amLODipine (NORVASC) tablet 5 mg  5 mg Oral Daily Peter M Swaz[place]Jordan, MD   5 mg at 07/19/13 1029  . antiseptic oral rinse (BIOTENE) solution 15 mL  15 mL Mouth Rinse BID Iran OuchMuhammad A Arida, MD   15 mL at 07/19/13 2000  . aspirin chewable tablet 81 mg  81 mg Oral Daily Iran OuchMuhammad A Arida, MD   81 mg at 07/19/13 0953  . metoprolol tartrate (LOPRESSOR) tablet 25 mg  25 mg Oral BID Iran OuchMuhammad A Arida, MD   25 mg at 07/19/13 2218  . oxyCODONE (Oxy IR/ROXICODONE) immediate release tablet 5 mg  5 mg Oral Q4H PRN Iran OuchMuhammad A Arida, MD      . pantoprazole (PROTONIX) EC tablet 40 mg  40 mg Oral BID Iran OuchMuhammad A Arida, MD   40 mg at 07/19/13 2218  . potassium chloride SA (K-DUR,KLOR-CON) CR tablet 20 mEq  20 mEq Oral Daily Peter M SwazilandJordan, MD   20 mEq at 07/19/13 2017  . rosuvastatin (CRESTOR) tablet 20 mg  20 mg Oral q1800 Baird KayKelley L Miller, RPH   20 mg at 07/19/13 1821  . ticagrelor (BRILINTA) tablet 90 mg  90 mg Oral BID Iran OuchMuhammad A Arida, MD   90 mg at  07/19/13 2218  . traMADol (ULTRAM) tablet 50 mg  50 mg Oral Q4H PRN Iran OuchMuhammad A Arida, MD        PE: General appearance: alert, cooperative and no distress Neck: no JVD Lungs: clear to auscultation bilaterally Heart: regular rate and rhythm and 2/6 SM best heard at RUSB Extremities: no LEE Pulses: 2+ and symmetric Skin: warm and dry Neurologic: Grossly normal  Lab Results:   Recent Labs  07/18/13 1139 07/19/13 0215  WBC 8.8 9.3  HGB 15.9 15.5  HCT 45.7 45.3  PLT 178 173   BMET  Recent Labs  07/18/13 1139 07/19/13 0215  NA 138 139  K 3.8 3.2*  CL 98 98  CO2 21 25  GLUCOSE 120* 101*  BUN 7 7  CREATININE 0.73 0.75  CALCIUM 9.0 8.7   PT/INR No results found for this basename: LABPROT, INR,  in the last 72 hours Cholesterol  Recent Labs  07/19/13 0215  CHOL 193   Cardiac Panel (last 3 results)  Recent Labs  07/18/13 1139 07/18/13 1645 07/18/13 2326  TROPONINI 1.51* 6.54* 5.17*     Assessment/Plan Principal Problem:   ST elevation myocardial infarction (STEMI) of inferior wall Active Problems:   CAD, multiple vessel, LAD   HTN (hypertension)  Hyperlipidemia LDL goal <70   Hematuria  1. Inferior STEMI: s/p DES to mid RCA. Continue DAPT with ASA + Brilinta for a minimum of 1 year. Continue BB and statin. Post PCI echo revealed normal LV function. EF 60-65%.   2. CAD: multivessel disease. S/p PCI to mid RCA. Also with residual disease: 60-70% distal LAD stenosis and 80-90% proximal stenosis in a large OM2. Plan is for medical therapy. Resume DAPT, BB and statin. Pt has decided to not participate in the Complete research study. If he has recurrent angina, he may require a long acting oral nitrate or attmepted PCI on LAD and/or OM2.  3. HTN: mildly elevated at 137/94. Continue BB and amlodipine. ? Adding low dose ACE-I given recent STEMI. Renal function is normal.   4. HLD: LDL elevated at 109 mg/dL. Goal in the setting of CAD is < 70. Triglycerides  also elevated at 282. Continue statin thearpy with Crestor. Will need to reassess as an OP in ~3 months. If not significantly improved, will need to consider adding Zetia.   5: Prediabetes: Hgb A1c 6.1. Discussed proper diet and increasing physical activity, once cleared by Dr. Kirke CorinArida, to prevent progression to DM. He was advised to have PCP follow yearly.   6. Post Catheterization: right radial access site is stable w/ 2+ radial pulse. Renal function is stable.   Dispo: Plan for discharge home later today. Will arrange f/u with Dr. Kirke CorinArida in [place].      LOS: 2 days    Brittainy M. Delmer IslamSimmons, PA-C 07/20/2013 7:48 AM   Attending Note:   The patient was seen and examined.  Agree with assessment and plan as noted above.  Changes made to the above note as needed.  Pt is doing great.  Walked 700 feet with cardiac rehab this am.   Normal EF.  Will follow up with Dr. Kirke CorinArida in [place].  Vesta MixerPhilip J. Nahser, Montez HagemanJr., MD, [facility] 07/20/2013, 8:48 AM

## 2013-07-22 ENCOUNTER — Telehealth: Payer: Self-pay

## 2013-07-22 NOTE — Telephone Encounter (Signed)
Pt came in, states the rx for Crestor was sent to his pharmacy and [employer] needs auth. States pharmacy sent over twice. Please call.

## 2013-07-22 NOTE — Telephone Encounter (Signed)
Patient was approved for Crestor 20 mg 1 tablet daily Approved 07/23/2013-indefinitely.

## 2013-08-03 ENCOUNTER — Ambulatory Visit (INDEPENDENT_AMBULATORY_CARE_PROVIDER_SITE_OTHER): Payer: Medicare Other | Admitting: Cardiovascular Disease

## 2013-08-03 ENCOUNTER — Encounter: Payer: Self-pay | Admitting: Cardiovascular Disease

## 2013-08-03 VITALS — BP 130/82 | HR 85 | Ht 68.0 in | Wt 196.5 lb

## 2013-08-03 DIAGNOSIS — I2510 Atherosclerotic heart disease of native coronary artery without angina pectoris: Secondary | ICD-10-CM

## 2013-08-03 DIAGNOSIS — E785 Hyperlipidemia, unspecified: Secondary | ICD-10-CM

## 2013-08-03 DIAGNOSIS — I10 Essential (primary) hypertension: Secondary | ICD-10-CM

## 2013-08-03 NOTE — Progress Notes (Signed)
Primary care physician: Dr. Leim FabryBarbara Aldridge  HPI  This is a pleasant 78 year old man who is here today for followup visit after recent hospitalization for inferior ST elevation myocardial infarction. He presented on June 20 with chest pain and was found to have inferior ST elevation. I proceeded with emergent cardiac catheterization via the right radial artery which showed an occluded mid right coronary artery,  60-70% distal LAD stenosis and 80-90% proximal OM stenosis. He underwent successful angioplasty and drug-eluting stent placement to the right coronary artery without complications. Ejection fraction was normal by echocardiogram. He has known history of hypertension and hyperlipidemia with previous intolerance to atorvastatin. He has been doing very well since hospital discharge with no symptoms of chest pain, shortness of breath or palpitations. He has been taking oral medications with no reported side effects.  Allergies  Allergen Reactions  . Lipitor [Atorvastatin] Other (See Comments)    Muscle aches severe      Current Outpatient Prescriptions on File Prior to Visit  Medication Sig Dispense Refill  . allopurinol (ZYLOPRIM) 300 MG tablet Take 300 mg by mouth daily.      Marland Kitchen. amLODipine (NORVASC) 5 MG tablet Take 1 tablet (5 mg total) by mouth every morning.  30 tablet  5  . aspirin EC 81 MG tablet Take 81 mg by mouth daily.      . meloxicam (MOBIC) 7.5 MG tablet Take 7.5 mg by mouth daily as needed.       . metoprolol tartrate (LOPRESSOR) 25 MG tablet Take 1 tablet (25 mg total) by mouth 2 (two) times daily.  60 tablet  5  . nitroGLYCERIN (NITROSTAT) 0.4 MG SL tablet Place 1 tablet (0.4 mg total) under the tongue every 5 (five) minutes as needed for chest pain.  25 tablet  2  . potassium chloride SA (K-DUR,KLOR-CON) 20 MEQ tablet Take 20 mEq by mouth daily.      . rosuvastatin (CRESTOR) 20 MG tablet Take 1 tablet (20 mg total) by mouth daily at 6 PM.  30 tablet  5  . ticagrelor  (BRILINTA) 90 MG TABS tablet Take 1 tablet (90 mg total) by mouth 2 (two) times daily.  60 tablet  10  . ticagrelor (BRILINTA) 90 MG TABS tablet Take 1 tablet (90 mg total) by mouth 2 (two) times daily.  60 tablet  0   No current facility-administered medications on file prior to visit.     Past Medical History  Diagnosis Date  . CAD, multiple vessel, LAD 07/18/2013    Inferior ST elevation myocardial infarction. Cardiac catheterization showed an occluded mid RCA with significant proximal disease, 60-70% distal LAD stenosis and 80-90% OM stenosis. EF was normal by echo. He underwent successful angioplasty and drug-eluting stent placement to the right coronary artery with a 3.5 x 33 mm Xience drug eluting stent postdilated with a 4 mm noncompliant balloon  . Hyperlipidemia LDL goal <70 07/18/2013  . Essential hypertension 07/18/2013     Past Surgical History  Procedure Laterality Date  . Cardiac catheterization    . Replacement total knee      rt     Family History  Problem Relation Age of Onset  . Diabetes type II Father      History   Social History  . Marital Status: Married    Spouse Name: N/A    Number of Children: N/A  . Years of Education: N/A   Occupational History  . Not on file.   Social History Main Topics  .  Smoking status: Former Smoker    Quit date: 01/30/1960  . Smokeless tobacco: Never Used  . Alcohol Use: No  . Drug Use: No  . Sexual Activity: Not on file   Other Topics Concern  . Not on file   Social History Narrative  . No narrative on file   ROS: A 10 point review of system was performed. It is negative other than that mentioned in the history of present illness.    PHYSICAL EXAM   BP 130/82  Pulse 85  Ht 5\' 8"  (1.727 m)  Wt 196 lb 8 oz (89.132 kg)  BMI 29.88 kg/m2 Constitutional: He is oriented to person, place, and time. He appears well-developed and well-nourished. No distress.  HENT: No nasal discharge.  Head: Normocephalic and  atraumatic.  Eyes: Pupils are equal and round.  No discharge. Neck: Normal range of motion. Neck supple. No JVD present. No thyromegaly present.  Cardiovascular: Normal rate, regular rhythm, normal heart sounds. Exam reveals no gallop and no friction rub. There is a 2/6 systolic ejection murmur at the aortic area Pulmonary/Chest: Effort normal and breath sounds normal. No stridor. No respiratory distress. He has no wheezes. He has no rales. He exhibits no tenderness.  Abdominal: Soft. Bowel sounds are normal. He exhibits no distension. There is no tenderness. There is no rebound and no guarding.  Musculoskeletal: Normal range of motion. He exhibits no edema and no tenderness.  Neurological: He is alert and oriented to person, place, and time. Coordination normal.  Skin: Skin is warm and dry. No rash noted. He is not diaphoretic. No erythema. No pallor.  Psychiatric: He has a normal mood and affect. His behavior is normal. Judgment and thought content normal.  Right radial pulse is normal with no hematoma     WNU:UVOZDEKG:Sinus  Rhythm  - occasional PAC    # PACs = 1. -RSR(V1) -nondiagnostic.   -Old inferior-apical infarct.   ABNORMAL    ASSESSMENT AND PLAN

## 2013-08-03 NOTE — Assessment & Plan Note (Signed)
Blood pressure is well controlled on current medications. 

## 2013-08-03 NOTE — Assessment & Plan Note (Signed)
The patient is doing very well after her recent presentation with inferior ST elevation myocardial infarction. He had successful percutaneous revascularization of the right coronary artery with drug-eluting stent placement. He does have residual coronary artery disease. However, he completely denies any anginal symptoms. Thus, I recommend continuing medical therapy. I advised him to attend cardiac rehabilitation but he wants to exercise on his own. The plan is to continue dual antiplatelet therapy for at least 12 months.

## 2013-08-03 NOTE — Assessment & Plan Note (Signed)
Lab Results  Component Value Date   CHOL 193 07/19/2013   HDL 28* 07/19/2013   LDLCALC 109* 07/19/2013   TRIG 282* 07/19/2013   CHOLHDL 6.9 07/19/2013   He was started on Crestor 20 mg once daily after his myocardial infarction since he is intolerant to atorvastatin. I requested fasting lipid and liver profile in one month.

## 2013-08-03 NOTE — Patient Instructions (Signed)
Your physician recommends that you return for lab work in:  Fasting labs in 1 month   Your physician recommends that you schedule a follow-up appointment in:  3 months with Dr. Kirke CorinArida

## 2013-08-13 ENCOUNTER — Other Ambulatory Visit: Payer: Self-pay | Admitting: *Deleted

## 2013-08-13 MED ORDER — TICAGRELOR 90 MG PO TABS: 90.0000 mg | ORAL_TABLET | Freq: Two times a day (BID) | ORAL | Status: DC

## 2013-08-13 MED ORDER — METOPROLOL TARTRATE 25 MG PO TABS: 25.0000 mg | ORAL_TABLET | Freq: Two times a day (BID) | ORAL | Status: DC

## 2013-08-13 MED ORDER — AMLODIPINE BESYLATE 5 MG PO TABS: 5.0000 mg | ORAL_TABLET | Freq: Every morning | ORAL | Status: DC

## 2013-08-13 MED ORDER — ROSUVASTATIN CALCIUM 20 MG PO TABS: 20.0000 mg | ORAL_TABLET | Freq: Every day | ORAL | Status: DC

## 2013-08-18 DIAGNOSIS — I10 Essential (primary) hypertension: Secondary | ICD-10-CM

## 2013-08-18 DIAGNOSIS — E785 Hyperlipidemia, unspecified: Secondary | ICD-10-CM

## 2013-09-03 ENCOUNTER — Ambulatory Visit (INDEPENDENT_AMBULATORY_CARE_PROVIDER_SITE_OTHER): Payer: Medicare Other

## 2013-09-03 DIAGNOSIS — E785 Hyperlipidemia, unspecified: Secondary | ICD-10-CM

## 2013-09-04 LAB — LIPID PANEL
Chol/HDL Ratio: 2.8 ratio units (ref 0.0–5.0)
Cholesterol, Total: 93 mg/dL — ABNORMAL LOW (ref 100–199)
HDL: 33 mg/dL — ABNORMAL LOW (ref >39)
LDL CALC: 37 mg/dL (ref 0–99)
TRIGLYCERIDES: 117 mg/dL (ref 0–149)
VLDL CHOLESTEROL CAL: 23 mg/dL (ref 5–40)

## 2013-09-04 LAB — HEPATIC FUNCTION PANEL
ALK PHOS: 103 IU/L (ref 39–117)
ALT: 56 IU/L — AB (ref 0–44)
AST: 75 IU/L — ABNORMAL HIGH (ref 0–40)
Albumin: 4.3 g/dL (ref 3.5–4.8)
Bilirubin, Direct: 0.4 mg/dL (ref 0.00–0.40)
Total Bilirubin: 1.4 mg/dL — ABNORMAL HIGH (ref 0.0–1.2)
Total Protein: 7 g/dL (ref 6.0–8.5)

## 2013-09-08 ENCOUNTER — Telehealth: Payer: Self-pay | Admitting: *Deleted

## 2013-09-08 DIAGNOSIS — E785 Hyperlipidemia, unspecified: Secondary | ICD-10-CM

## 2013-09-08 MED ORDER — ROSUVASTATIN CALCIUM 10 MG PO TABS: 10.0000 mg | ORAL_TABLET | Freq: Every day | ORAL | Status: DC

## 2013-09-08 NOTE — Telephone Encounter (Signed)
Message copied by Fransico SettersBAUCOM, Alexzia Kasler E on Tue Sep 08, 2013 10:30 AM ------      Message from: Lorine BearsARIDA, MUHAMMAD A      Created: Fri Sep 04, 2013  5:48 PM       Inform patient that labs showed mildly abnormal liver enzymes. Cholesterol was excellent. Decrease Crestor to 10 mg once daily. Recheck labs in 2 months. ------

## 2013-09-08 NOTE — Telephone Encounter (Signed)
Informed patient Patient verbalized understaniding

## 2013-09-08 NOTE — Telephone Encounter (Signed)
Unfortunately, it is not safe to stop Brilinta now given recent heart stent.  He should inform his dermatologist that he is on both Asprin and Brilinta.

## 2013-09-08 NOTE — Telephone Encounter (Signed)
Called patient to discuss lab results He stated he was having a skin cancer removed  He stated he has had this done in the past and "bled like a stuck hog" He wants to know if he should hold his Brilinta?

## 2013-09-09 ENCOUNTER — Telehealth: Payer: Self-pay | Admitting: *Deleted

## 2013-09-09 NOTE — Telephone Encounter (Signed)
error 

## 2013-09-15 ENCOUNTER — Encounter: Payer: Self-pay | Admitting: Cardiovascular Disease

## 2013-09-18 ENCOUNTER — Telehealth: Payer: Self-pay | Admitting: *Deleted

## 2013-09-18 NOTE — Telephone Encounter (Signed)
Please call patient. He wants to know about his labs and why was his Crestor was cut in half?

## 2013-09-18 NOTE — Telephone Encounter (Signed)
Explained liver panel to patient  Patient verbalized understanding

## 2013-09-29 ENCOUNTER — Encounter: Payer: Self-pay | Admitting: Cardiovascular Disease

## 2013-10-01 ENCOUNTER — Telehealth: Payer: Self-pay | Admitting: *Deleted

## 2013-10-01 MED ORDER — CLOPIDOGREL BISULFATE 75 MG PO TABS: 75.0000 mg | ORAL_TABLET | Freq: Every day | ORAL | Status: DC

## 2013-10-01 NOTE — Telephone Encounter (Signed)
Message copied by Fransico Setters on Thu Oct 01, 2013  5:05 PM ------      Message from: [name] A      Created: Thu Oct 01, 2013  5:02 PM       He needs to have skin cancer removed. Stop Brilinta today and start Plavix 75 mg once daily tomorrow  in order to decrease risk of bleeding. I spoke with his dermatologist.  ------

## 2013-10-01 NOTE — Telephone Encounter (Signed)
Informed patient of Dr. Sheilah Pigeon reccomendation   Patient verbalized understanding

## 2013-10-02 ENCOUNTER — Telehealth: Payer: Self-pay

## 2013-10-02 NOTE — Telephone Encounter (Signed)
Pt states he was started on Plavix, yesterday, he states he is having minor surgery on Tuesday, he is having a place taken off his face, wants to know if he should start the Plavix. Please call and advise.

## 2013-10-02 NOTE — Telephone Encounter (Signed)
Reviewed medication instructions with patient  Informed him that per Dr. Kirke Corin he is to start and stay on Plavix  Patient verbalized understanding

## 2013-10-06 ENCOUNTER — Other Ambulatory Visit: Payer: Self-pay

## 2013-10-06 MED ORDER — CLOPIDOGREL BISULFATE 75 MG PO TABS: 75.0000 mg | ORAL_TABLET | Freq: Every day | ORAL | Status: DC

## 2013-10-06 NOTE — Telephone Encounter (Signed)
Refill sent for plavix  

## 2013-10-09 ENCOUNTER — Telehealth: Payer: Self-pay | Admitting: *Deleted

## 2013-10-09 NOTE — Telephone Encounter (Signed)
error 

## 2013-10-29 ENCOUNTER — Encounter: Payer: Self-pay | Admitting: Cardiovascular Disease

## 2013-11-09 ENCOUNTER — Other Ambulatory Visit (INDEPENDENT_AMBULATORY_CARE_PROVIDER_SITE_OTHER): Payer: Medicare Other

## 2013-11-09 DIAGNOSIS — E785 Hyperlipidemia, unspecified: Secondary | ICD-10-CM

## 2013-11-10 LAB — HEPATIC FUNCTION PANEL
ALT: 28 IU/L (ref 0–44)
AST: 42 IU/L — ABNORMAL HIGH (ref 0–40)
Albumin: 4.1 g/dL (ref 3.5–4.8)
Alkaline Phosphatase: 80 IU/L (ref 39–117)
BILIRUBIN TOTAL: 1.2 mg/dL (ref 0.0–1.2)
Bilirubin, Direct: 0.35 mg/dL (ref 0.00–0.40)
Total Protein: 6.8 g/dL (ref 6.0–8.5)

## 2013-11-10 LAB — LIPID PANEL
Chol/HDL Ratio: 3.3 ratio units (ref 0.0–5.0)
Cholesterol, Total: 117 mg/dL (ref 100–199)
HDL: 35 mg/dL — AB (ref >39)
LDL Calculated: 49 mg/dL (ref 0–99)
Triglycerides: 167 mg/dL — ABNORMAL HIGH (ref 0–149)
VLDL CHOLESTEROL CAL: 33 mg/dL (ref 5–40)

## 2013-11-24 ENCOUNTER — Encounter: Payer: Self-pay | Admitting: Cardiovascular Disease

## 2013-11-24 ENCOUNTER — Ambulatory Visit (INDEPENDENT_AMBULATORY_CARE_PROVIDER_SITE_OTHER): Payer: Medicare Other | Admitting: Cardiovascular Disease

## 2013-11-24 VITALS — BP 152/82 | HR 87 | Ht 68.0 in | Wt 194.0 lb

## 2013-11-24 DIAGNOSIS — I2510 Atherosclerotic heart disease of native coronary artery without angina pectoris: Secondary | ICD-10-CM

## 2013-11-24 DIAGNOSIS — I10 Essential (primary) hypertension: Secondary | ICD-10-CM

## 2013-11-24 DIAGNOSIS — E785 Hyperlipidemia, unspecified: Secondary | ICD-10-CM

## 2013-11-24 MED ORDER — METOPROLOL TARTRATE 50 MG PO TABS: 50.0000 mg | ORAL_TABLET | Freq: Two times a day (BID) | ORAL | Status: DC

## 2013-11-24 NOTE — Assessment & Plan Note (Signed)
He is doing well overall with no symptoms suggestive of angina. Continue medical therapy. 

## 2013-11-24 NOTE — Patient Instructions (Signed)
Increase Metoprolol to 50 mg twice daily.  Continue other medications.   Your physician wants you to follow-up in: 6 months.  You will receive a reminder letter in the mail two months in advance. If you don't receive a letter, please call our office to schedule the follow-up appointment.  Your next appointment will be scheduled in our new office located at :  [facility]  [address], [address]  [place], [place] [id number]

## 2013-11-24 NOTE — Assessment & Plan Note (Signed)
Lab Results  Component Value Date   CHOL 193 07/19/2013   HDL 35* 11/09/2013   LDLCALC 49 11/09/2013   TRIG 167* 11/09/2013   CHOLHDL 3.3 11/09/2013   Continue treatment with rosuvastatin. He has history of intolerance to other statins.

## 2013-11-24 NOTE — Assessment & Plan Note (Signed)
Blood pressure is elevated today. However, he had a high sodium meal for lunch.  Given his high resting heart rate, I increased the dose of metoprolol to 50 mg twice daily.

## 2013-11-24 NOTE — Progress Notes (Signed)
Primary care physician: Dr. Leim FabryBarbara Aldridge  HPI  This is a pleasant 78 year old man who is here today for followup visit regarding coronary artery disease . He presented on June 20 with chest pain and was found to have inferior ST elevation. I proceeded with emergent cardiac catheterization  which showed an occluded mid right coronary artery,  60-70% distal LAD stenosis and 80-90% proximal OM stenosis. He underwent successful angioplasty and drug-eluting stent placement to the right coronary artery without complications. Ejection fraction was normal by echocardiogram. He has known history of hypertension and hyperlipidemia with previous intolerance to atorvastatin. He has been doing very well  with no symptoms of chest pain, shortness of breath or palpitations. He has been taking oral medications with no reported side effects. He attended cardiac rehabilitation.  Allergies  Allergen Reactions  . Lipitor [Atorvastatin] Other (See Comments)    Muscle aches severe      Current Outpatient Prescriptions on File Prior to Visit  Medication Sig Dispense Refill  . allopurinol (ZYLOPRIM) 300 MG tablet Take 300 mg by mouth daily.      Marland Kitchen. amLODipine (NORVASC) 5 MG tablet Take 1 tablet (5 mg total) by mouth every morning.  90 tablet  3  . aspirin EC 81 MG tablet Take 81 mg by mouth daily.      . clopidogrel (PLAVIX) 75 MG tablet Take 1 tablet (75 mg total) by mouth daily.  90 tablet  3  . meloxicam (MOBIC) 7.5 MG tablet Take 7.5 mg by mouth daily as needed.       . metoprolol tartrate (LOPRESSOR) 25 MG tablet Take 1 tablet (25 mg total) by mouth 2 (two) times daily.  180 tablet  3  . nitroGLYCERIN (NITROSTAT) 0.4 MG SL tablet Place 1 tablet (0.4 mg total) under the tongue every 5 (five) minutes as needed for chest pain.  25 tablet  2  . potassium chloride SA (K-DUR,KLOR-CON) 20 MEQ tablet Take 20 mEq by mouth daily.      . rosuvastatin (CRESTOR) 10 MG tablet Take 1 tablet (10 mg total) by mouth daily.   90 tablet  3   No current facility-administered medications on file prior to visit.     Past Medical History  Diagnosis Date  . CAD, multiple vessel, LAD 07/18/2013    Inferior ST elevation myocardial infarction. Cardiac catheterization showed an occluded mid RCA with significant proximal disease, 60-70% distal LAD stenosis and 80-90% OM stenosis. EF was normal by echo. He underwent successful angioplasty and drug-eluting stent placement to the right coronary artery with a 3.5 x 33 mm Xience drug eluting stent postdilated with a 4 mm noncompliant balloon  . Hyperlipidemia LDL goal <70 07/18/2013  . Essential hypertension 07/18/2013     Past Surgical History  Procedure Laterality Date  . Cardiac catheterization    . Replacement total knee      rt     Family History  Problem Relation Age of Onset  . Diabetes type II Father      History   Social History  . Marital Status: Married    Spouse Name: N/A    Number of Children: N/A  . Years of Education: N/A   Occupational History  . Not on file.   Social History Main Topics  . Smoking status: Former Smoker    Quit date: 01/30/1960  . Smokeless tobacco: Never Used  . Alcohol Use: No  . Drug Use: No  . Sexual Activity: Not on file   Other  Topics Concern  . Not on file   Social History Narrative  . No narrative on file   ROS: A 10 point review of system was performed. It is negative other than that mentioned in the history of present illness.    PHYSICAL EXAM   There were no vitals taken for this visit. Constitutional: He is oriented to person, place, and time. He appears well-developed and well-nourished. No distress.  HENT: No nasal discharge.  Head: Normocephalic and atraumatic.  Eyes: Pupils are equal and round.  No discharge. Neck: Normal range of motion. Neck supple. No JVD present. No thyromegaly present.  Cardiovascular: Normal rate, regular rhythm, normal heart sounds. Exam reveals no gallop and no  friction rub. There is a 2/6 systolic ejection murmur at the aortic area Pulmonary/Chest: Effort normal and breath sounds normal. No stridor. No respiratory distress. He has no wheezes. He has no rales. He exhibits no tenderness.  Abdominal: Soft. Bowel sounds are normal. He exhibits no distension. There is no tenderness. There is no rebound and no guarding.  Musculoskeletal: Normal range of motion. He exhibits no edema and no tenderness.  Neurological: He is alert and oriented to person, place, and time. Coordination normal.  Skin: Skin is warm and dry. No rash noted. He is not diaphoretic. No erythema. No pallor.  Psychiatric: He has a normal mood and affect. His behavior is normal. Judgment and thought content normal.      ASSESSMENT AND PLAN

## 2013-12-21 ENCOUNTER — Ambulatory Visit: Payer: Medicare Other | Admitting: Cardiovascular Disease

## 2014-01-07 ENCOUNTER — Encounter (HOSPITAL_COMMUNITY): Payer: Self-pay | Admitting: Cardiovascular Disease

## 2014-03-26 ENCOUNTER — Encounter: Payer: Self-pay | Admitting: Cardiovascular Disease

## 2014-03-26 ENCOUNTER — Ambulatory Visit (INDEPENDENT_AMBULATORY_CARE_PROVIDER_SITE_OTHER): Payer: Medicare Other | Admitting: Cardiovascular Disease

## 2014-03-26 VITALS — BP 122/62 | HR 61 | Ht 68.0 in | Wt 191.8 lb

## 2014-03-26 DIAGNOSIS — E785 Hyperlipidemia, unspecified: Secondary | ICD-10-CM

## 2014-03-26 DIAGNOSIS — I10 Essential (primary) hypertension: Secondary | ICD-10-CM

## 2014-03-26 DIAGNOSIS — I2510 Atherosclerotic heart disease of native coronary artery without angina pectoris: Secondary | ICD-10-CM

## 2014-03-26 NOTE — Patient Instructions (Signed)
Continue same medications.   Your physician wants you to follow-up in: 6 months.  You will receive a reminder letter in the mail two months in advance. If you don't receive a letter, please call our office to schedule the follow-up appointment.  

## 2014-03-26 NOTE — Assessment & Plan Note (Signed)
He is doing very well with no symptoms suggestive of angina. Continue medical therapy. He does have residual coronary artery disease which is being treated medically given the lack of anginal symptoms.

## 2014-03-26 NOTE — Assessment & Plan Note (Signed)
Lab Results  Component Value Date   CHOL 193 07/19/2013   HDL 35* 11/09/2013   LDLCALC 49 11/09/2013   TRIG 167* 11/09/2013   CHOLHDL 3.3 11/09/2013   Continue treatment with rosuvastatin. LDL was 49.

## 2014-03-26 NOTE — Assessment & Plan Note (Signed)
Blood pressure is now well controlled after increasing the dose of metoprolol

## 2014-03-26 NOTE — Progress Notes (Signed)
Primary care physician: Dr. [name]  HPI  This is a pleasant 79 year old man who is here today for followup visit regarding coronary artery disease . He presented on July 18, 2013 with chest pain and was found to have inferior ST elevation. I proceeded with emergent cardiac catheterization  which showed an occluded mid right coronary artery,  60-70% distal LAD stenosis and 80-90% proximal OM stenosis. He underwent successful angioplasty and drug-eluting stent placement to the right coronary artery without complications. Ejection fraction was normal by echocardiogram. He has known history of hypertension and hyperlipidemia with previous intolerance to atorvastatin. He has been doing very well  with no symptoms of chest pain, shortness of breath or palpitations. He has been taking oral medications with no reported side effects.  During last visit, I increased metoprolol to 50 mg twice daily. He has been doing very well and denies any chest pain or shortness of breath. He is volunteering at cardiac rehabilitation. He did slow down due to back pain and knee problem.  Allergies  Allergen Reactions  . Lipitor [Atorvastatin] Other (See Comments)    Muscle aches severe      Current Outpatient Prescriptions on File Prior to Visit  Medication Sig Dispense Refill  . amLODipine (NORVASC) 5 MG tablet Take 1 tablet (5 mg total) by mouth every morning. 90 tablet 3  . aspirin EC 81 MG tablet Take 81 mg by mouth daily.    . clopidogrel (PLAVIX) 75 MG tablet Take 1 tablet (75 mg total) by mouth daily. 90 tablet 3  . meloxicam (MOBIC) 7.5 MG tablet Take 7.5 mg by mouth daily.     . nitroGLYCERIN (NITROSTAT) 0.4 MG SL tablet Place 1 tablet (0.4 mg total) under the tongue every 5 (five) minutes as needed for chest pain. 25 tablet 2  . potassium chloride SA (K-DUR,KLOR-CON) 20 MEQ tablet Take 20 mEq by mouth daily.    . rosuvastatin (CRESTOR) 10 MG tablet Take 1 tablet (10 mg total) by mouth daily. 90  tablet 3   No current facility-administered medications on file prior to visit.     Past Medical History  Diagnosis Date  . CAD, multiple vessel, LAD 07/18/2013    Inferior ST elevation myocardial infarction. Cardiac catheterization showed an occluded mid RCA with significant proximal disease, 60-70% distal LAD stenosis and 80-90% OM stenosis. EF was normal by echo. He underwent successful angioplasty and drug-eluting stent placement to the right coronary artery with a 3.5 x 33 mm Xience drug eluting stent postdilated with a 4 mm noncompliant balloon  . Hyperlipidemia LDL goal <70 07/18/2013  . Essential hypertension 07/18/2013  . Compression fracture      Past Surgical History  Procedure Laterality Date  . Cardiac catheterization    . Replacement total knee      rt  . Left heart catheterization with coronary angiogram N/A 07/18/2013    Procedure: LEFT HEART CATHETERIZATION WITH CORONARY ANGIOGRAM;  Surgeon: Iran Ouch, MD;  Location: [facility] CATH [facility];  Service: Cardiovascular;  Laterality: N/A;     Family History  Problem Relation Age of Onset  . Diabetes type II Father      History   Social History  . Marital Status: Married    Spouse Name: N/A  . Number of Children: N/A  . Years of Education: N/A   Occupational History  . Not on file.   Social History Main Topics  . Smoking status: Former Smoker    Quit date: 01/30/1960  .  Smokeless tobacco: Never Used  . Alcohol Use: No  . Drug Use: No  . Sexual Activity: Not on file   Other Topics Concern  . Not on file   Social History Narrative   ROS: A 10 point review of system was performed. It is negative other than that mentioned in the history of present illness.    PHYSICAL EXAM   BP 122/62 mmHg  Pulse 61  Ht 5\' 8"  (1.727 m)  Wt 191 lb 12 oz (86.977 kg)  BMI 29.16 kg/m2 Constitutional: He is oriented to person, place, and time. He appears well-developed and well-nourished. No distress.  HENT: No nasal  discharge.  Head: Normocephalic and atraumatic.  Eyes: Pupils are equal and round.  No discharge. Neck: Normal range of motion. Neck supple. No JVD present. No thyromegaly present.  Cardiovascular: Normal rate, regular rhythm, normal heart sounds. Exam reveals no gallop and no friction rub. There is a 2/6 systolic ejection murmur at the aortic area Pulmonary/Chest: Effort normal and breath sounds normal. No stridor. No respiratory distress. He has no wheezes. He has no rales. He exhibits no tenderness.  Abdominal: Soft. Bowel sounds are normal. He exhibits no distension. There is no tenderness. There is no rebound and no guarding.  Musculoskeletal: Normal range of motion. He exhibits no edema and no tenderness.  Neurological: He is alert and oriented to person, place, and time. Coordination normal.  Skin: Skin is warm and dry. No rash noted. He is not diaphoretic. No erythema. No pallor.  Psychiatric: He has a normal mood and affect. His behavior is normal. Judgment and thought content normal.    ZOX:WRUEAEKG:Sinus  Rhythm  -RSR(V1) -nondiagnostic.   PROBABLY NORMAL  ASSESSMENT AND PLAN

## 2014-05-21 NOTE — Discharge Summary (Signed)
PATIENT NAME:  Flaten, Lonzell L MR#:  [id number] DATE OF BIRTH:  08/31/1935  DATE OF ADMISSION:  08/25/2012 DATE OF DISCHARGE:  08/27/2012  ADMITTING DIAGNOSIS: Degenerative arthrosis right knee.   DISCHARGE DIAGNOSIS: Degenerative arthrosis right knee.  SURGERY: Right total knee arthroplasty using computer-assisted navigation.   SURGEON: Dr. James Hooten.  ANESTHESIA: Spinal.   ESTIMATED BLOOD LOSS: 50 mL.  FLUIDS REPLACED: 1100 crystalloid.  TOURNIQUET TIME: 109 minutes.   DRAINS: Two medium drains to reinfusion system.   COMPLICATIONS: None.   IMPLANTS: DePuy PFC Sigma 4 CS F, 4 MBT T, 35 mm oval P, 15 mm RP PE    HISTORY: Mr. Paci is a 79-year-old male, who failed conservative measures and treatment for degenerative arthrosis of the right knee. He agreed and consented to a right total knee arthroplasty.  HOSPITAL COURSE: After initial admission on 08/25/2012, the patient underwent a right total knee arthroplasty. He had good pain control afterward and was brought to the orthopedic floor from the PACU. His initial hemoglobin on postoperative day 1, 08/26/2012, was 13.8 and platelets 183. Physical therapy was begun on that day. He also had a low potassium of 3.2. He was given 20 mEq twice that day. On postoperative day 2, 08/27/2012, he had some nausea after PT. His potassium was improved to 4.0. Hemoglobin 12.0 and platelets 156. He was able to ambulate 200 feet on postoperative day 1. He met his physical therapy goals and was discharged on postoperative day 2, 08/27/2012.   CONDITION AT DISCHARGE: Stable.   DISPOSITION: The patient was sent home with home health.   DISCHARGE INSTRUCTIONS: The patient will follow up with [facility] orthopedics in 2 weeks for staple removal. He will do physical therapy and weight bear as tolerated on the right lower extremity. He will have home health PT. His diet is regular. TED hose thigh high bilaterally. Dressing can be changed once daily  on as-needed basis. Please see discharge instructions for a list of resumption of home medications and additional new medications.   ____________________________ April M. Berndt, NP amb:aw D: 09/09/2012 13:53:21 ET T: 09/09/2012 14:19:36 ET JOB#: [id number]  cc: April M. Berndt, NP, <Dictator> APRIL M BERNDT FNP ELECTRONICALLY SIGNED 09/09/2012 14:49 

## 2014-05-21 NOTE — Op Note (Signed)
PATIENT NAME:  Glenn Wilson, Glenn Wilson MR#:  [id number] DATE OF BIRTH:  May 08, 1935  DATE OF PROCEDURE:  08/25/2012  PREOPERATIVE DIAGNOSIS: Degenerative arthrosis of the right knee.   POSTOPERATIVE DIAGNOSIS: Degenerative arthrosis of the right knee.   PROCEDURE PERFORMED: Right total knee arthroplasty using computer-assisted navigation.   SURGEON: Illene Labrador. Hooten. M.D.   ASSISTANT: April Berndt, NP, (required to maintain retraction throughout the procedure).   ANESTHESIA: Spinal.   ESTIMATED BLOOD LOSS: 50 mL.   FLUIDS REPLACED: 1100 mL of crystalloid.   TOURNIQUET TIME: 109 minutes.   DRAINS: Two medium drains to reinfusion system.   SOFT TISSUE RELEASES: Anterior cruciate ligament, posterior cruciate ligament, deep medial collateral ligament, and patellofemoral ligament.   IMPLANTS UTILIZED: DePuy PFC Sigma size 4 posterior stabilized femoral component (cemented), size 4 MBT tibial component (cemented), 35 mm 3-peg oval dome patella (cemented), and a 15 mm stabilized rotating platform polyethylene insert.   INDICATIONS FOR SURGERY: The patient is a 79 year old gentleman who has been seen for complaints of progressive right knee pain. X-rays demonstrated severe degenerative changes in tricompartmental fashion with relative varus deformity. After discussion of the risks and benefits of surgical intervention the patient expressed understanding of the risks and benefits and agreed with plans for surgical intervention.   PROCEDURE IN DETAIL: The patient was brought to the operating room and after adequate spinal anesthesia was achieved a tourniquet was placed on the patient's upper right thigh. The patient's right knee and leg were cleaned and prepped with alcohol and DuraPrep draped in the usual sterile fashion. A "time-out" was performed as per usual protocol. The right lower extremity was exsanguinated using an Esmarch and the tourniquet was inflated to 300 mmHg.   An anterior longitudinal  incision was made followed by a standard mid vastus approach.  Moderate effusion was evacuated. The deep fibers of the medial collateral ligament were elevated in a subperiosteal fashion off the medial flare of the tibia so as to maintain a continuous soft-tissue sleeve. The patella was subluxed laterally and the patellofemoral ligament was incised. Next, inspection of the knee demonstrated severe degenerative changes in tricompartmental fashion with eburnated bone noted to the medial compartment. Prominent osteophytes were debrided using a rongeur. Anterior and posterior cruciate ligaments were excised. Two 4.0 mm Schanz pins were inserted into the femur and into the tibia for attachment of the array of trackers used for computer-assisted navigation.   Hip center was identified using a circumduction technique. Distal landmarks were mapped using the computer. The distal femur and proximal tibia were mapped using the computer. Distal femoral cutting guide was positioned using computer-assisted navigation so as to achieve 5 degree distal valgus cut. Cut was performed and verified using the computer. The distal femur was sized and it was felt that a size 4 femoral component was appropriate. A size 4 cutting guide was positioned and the anterior cut was performed and verified using the computer. This was followed by the completion of the posterior and chamfer cuts. Femoral cutting guide for a central box was then positioned and the central box cut was performed.   Attention was then directed to the proximal tibia. Medial and lateral menisci were excised. The extramedullary tibial cutting guide was positioned using computer-assisted navigation so as to achieve 0 degree varus valgus alignment and 0 degree posterior slope. Cut was performed and verified using the computer. The proximal tibia was sized and it was felt that a size 4 tibial tray was appropriate. Tibial and femoral trials  were inserted followed by  insertion of first a 10 and subsequently a 12.5 and eventually a 15 mm polyethylene insert. Excellent mediolateral soft-tissue balancing was appreciated with a 15 mm spacer.  Finally, the patella was cut and repaired so as to accommodate a 35 mm 3-peg oval dome patella. Patellar trial was placed and the knee was placed through a range of motion with excellent patellar tracking appreciated.   Femoral trial was removed after debridement of posterior osteophytes. Central post hole for the tibial component was reamed followed by insertion of a keel punch. Tibial trials were then removed. The cut surfaces of bone were irrigated with copious amounts of normal saline with antibiotic solution using pulsatile lavage and then suctioned dry. Polymethyl methacrylate cement was prepared in the usual fashion using a vacuum mixer. Cement was applied to the cut surface of the proximal tibia as well as along the undersurface of a size 4 MBT tibial component. The tibial component was positioned and impacted into place. Excess cement was removed using freer elevators.   Next, cement was applied to the cut surface of the femur as well as along the posterior flanges of a size 4 posterior stabilized femoral component. Femoral component was positioned and impacted into place. Excess cement was removed using freer elevators. A 15 mm polyethylene trial was inserted and the knee was brought in full extension with steady axial compression applied.   Finally, cement was applied to the backside of a 35 mm 3-peg oval dome patella and the patellar component was positioned and patellar clamp applied. Excess cement was removed using freer elevators.   After adequate curing of cement, the tourniquet was deflated after a total tourniquet time of 109 minutes. Hemostasis was achieved using electrocautery. The knee was irrigated with copious amounts of normal saline with antibiotic solution and then suctioned dry. The knee was inspected for  any residual cement debris. Then 20 mL of 1.5% Exparel and 40 mL of injectable normal saline were injected along the posterior capsule, medial and lateral gutters, and along the arthrotomy site. A 15 mm stabilized rotating platform polyethylene insert was inserted and the knee was placed through a range of motion with excellent mediolateral soft-tissue balancing appreciated both in full extension and in flexion. Excellent patellar tracking was appreciated.   Two medium drains were placed in the wound bed and brought out through a separate stab incision to be attached to reinfusion system. The medial parapatellar portion of the incision was reapproximated using interrupted sutures of #1 Vicryl. The subcutaneous tissue was reapproximated in layers using first #0 Vicryl followed by #2-0 Vicryl. The subcutaneous tissue was injected with 30 mL of 0.25% Marcaine with epinephrine. Skin was approximated with skin staples. A sterile dressing was applied.   The patient tolerated the procedure well. He was transported to the recovery room in stable condition.     ____________________________ Illene LabradorJames P. Angie FavaHooten Jr., MD jph:np D: 08/25/2012 16:43:04 ET T: 08/25/2012 18:41:20 ET JOB#: [id number]  cc: Fayrene FearingJames P. Angie FavaHooten Jr., MD, <Dictator> Illene LabradorJAMES P Angie FavaHOOTEN JR MD ELECTRONICALLY SIGNED 08/29/2012 18:06

## 2014-05-24 ENCOUNTER — Ambulatory Visit: Payer: Medicare Other | Admitting: Cardiovascular Disease

## 2014-06-16 ENCOUNTER — Other Ambulatory Visit: Payer: Self-pay | Admitting: Cardiovascular Disease

## 2014-07-21 ENCOUNTER — Other Ambulatory Visit: Payer: Self-pay | Admitting: Specialist

## 2014-07-21 DIAGNOSIS — R05 Cough: Secondary | ICD-10-CM

## 2014-07-21 DIAGNOSIS — R053 Chronic cough: Secondary | ICD-10-CM

## 2014-07-21 DIAGNOSIS — J849 Interstitial pulmonary disease, unspecified: Secondary | ICD-10-CM

## 2014-07-27 ENCOUNTER — Ambulatory Visit
Admission: RE | Admit: 2014-07-27 | Discharge: 2014-07-27 | Disposition: A | Discharge status: Home / Self Care | Payer: Medicare Other | Source: Ambulatory Visit | Attending: Specialist | Admitting: Specialist

## 2014-07-27 DIAGNOSIS — R918 Other nonspecific abnormal finding of lung field: Secondary | ICD-10-CM | POA: Insufficient documentation

## 2014-07-27 DIAGNOSIS — R05 Cough: Secondary | ICD-10-CM | POA: Diagnosis present

## 2014-07-27 DIAGNOSIS — R053 Chronic cough: Secondary | ICD-10-CM

## 2014-07-27 DIAGNOSIS — J479 Bronchiectasis, uncomplicated: Secondary | ICD-10-CM | POA: Insufficient documentation

## 2014-07-27 DIAGNOSIS — J849 Interstitial pulmonary disease, unspecified: Secondary | ICD-10-CM

## 2014-07-27 DIAGNOSIS — E041 Nontoxic single thyroid nodule: Secondary | ICD-10-CM | POA: Diagnosis not present

## 2014-08-23 ENCOUNTER — Other Ambulatory Visit: Payer: Self-pay | Admitting: Cardiovascular Disease

## 2014-08-24 ENCOUNTER — Other Ambulatory Visit: Payer: Self-pay | Admitting: Specialist

## 2014-08-24 DIAGNOSIS — R911 Solitary pulmonary nodule: Secondary | ICD-10-CM

## 2014-09-19 ENCOUNTER — Other Ambulatory Visit: Payer: Self-pay | Admitting: Cardiovascular Disease

## 2014-09-23 ENCOUNTER — Encounter: Payer: Self-pay | Admitting: Cardiovascular Disease

## 2014-09-23 ENCOUNTER — Ambulatory Visit (INDEPENDENT_AMBULATORY_CARE_PROVIDER_SITE_OTHER): Payer: Medicare Other | Admitting: Cardiovascular Disease

## 2014-09-23 VITALS — BP 140/70 | HR 67 | Ht 68.0 in | Wt 203.8 lb

## 2014-09-23 DIAGNOSIS — E785 Hyperlipidemia, unspecified: Secondary | ICD-10-CM

## 2014-09-23 DIAGNOSIS — I2510 Atherosclerotic heart disease of native coronary artery without angina pectoris: Secondary | ICD-10-CM | POA: Diagnosis not present

## 2014-09-23 DIAGNOSIS — I10 Essential (primary) hypertension: Secondary | ICD-10-CM | POA: Diagnosis not present

## 2014-09-23 NOTE — Assessment & Plan Note (Signed)
Blood pressure is reasonably controlled on amlodipine and metoprolol.

## 2014-09-23 NOTE — Assessment & Plan Note (Signed)
He is doing very well with no symptoms suggestive of angina. I recommend continuing medical therapy. The plan is to keep him on dual antiplatelets therapy until at least June 2017.

## 2014-09-23 NOTE — Progress Notes (Signed)
Primary care physician: Dr. [name]  HPI  This is a pleasant 79 year old man who is here today for followup visit regarding coronary artery disease . He presented on July 18, 2013 with chest pain and was found to have inferior ST elevation. I proceeded with emergent cardiac catheterization  which showed an occluded mid right coronary artery,  60-70% distal LAD stenosis and 80-90% proximal OM stenosis. He underwent successful angioplasty and drug-eluting stent placement to the right coronary artery without complications. Ejection fraction was normal by echocardiogram. He has known history of hypertension and hyperlipidemia with previous intolerance to atorvastatin. He has been doing very well  with no symptoms of chest pain, shortness of breath or palpitations. He has been taking oral medications with no reported side effects.  He has chronic back pain and was diagnosed with compression fractures and thus has slowed down with activities and exercise.  Allergies  Allergen Reactions  . Lipitor [Atorvastatin] Other (See Comments)    Muscle aches severe      Current Outpatient Prescriptions on File Prior to Visit  Medication Sig Dispense Refill  . amLODipine (NORVASC) 5 MG tablet TAKE 1 TABLET EVERY MORNING 90 tablet 3  . aspirin EC 81 MG tablet Take 81 mg by mouth daily.    . clopidogrel (PLAVIX) 75 MG tablet TAKE 1 TABLET DAILY 90 tablet 3  . meloxicam (MOBIC) 7.5 MG tablet Take 7.5 mg by mouth daily.     . nitroGLYCERIN (NITROSTAT) 0.4 MG SL tablet Place 1 tablet (0.4 mg total) under the tongue every 5 (five) minutes as needed for chest pain. 25 tablet 2  . potassium chloride SA (K-DUR,KLOR-CON) 20 MEQ tablet Take 20 mEq by mouth daily.    . rosuvastatin (CRESTOR) 10 MG tablet Take 1 tablet (10 mg total) by mouth daily. 90 tablet 3   No current facility-administered medications on file prior to visit.     Past Medical History  Diagnosis Date  . CAD, multiple vessel, LAD  07/18/2013    Inferior ST elevation myocardial infarction. Cardiac catheterization showed an occluded mid RCA with significant proximal disease, 60-70% distal LAD stenosis and 80-90% OM stenosis. EF was normal by echo. He underwent successful angioplasty and drug-eluting stent placement to the right coronary artery with a 3.5 x 33 mm Xience drug eluting stent postdilated with a 4 mm noncompliant balloon  . Hyperlipidemia LDL goal <70 07/18/2013  . Essential hypertension 07/18/2013  . Compression fracture      Past Surgical History  Procedure Laterality Date  . Cardiac catheterization    . Replacement total knee      rt  . Left heart catheterization with coronary angiogram N/A 07/18/2013    Procedure: LEFT HEART CATHETERIZATION WITH CORONARY ANGIOGRAM;  Surgeon: Iran Ouch, MD;  Location: [facility];  Service: Cardiovascular;  Laterality: N/A;     Family History  Problem Relation Age of Onset  . Diabetes type II Father      Social History   Social History  . Marital Status: Married    Spouse Name: N/A  . Number of Children: N/A  . Years of Education: N/A   Occupational History  . Not on file.   Social History Main Topics  . Smoking status: Former Smoker    Quit date: 01/30/1960  . Smokeless tobacco: Never Used  . Alcohol Use: No  . Drug Use: No  . Sexual Activity: Not on file   Other Topics Concern  . Not on file  Social History Narrative   ROS: A 10 point review of system was performed. It is negative other than that mentioned in the history of present illness.    PHYSICAL EXAM   BP 140/70 mmHg  Pulse 67  Ht  (1.727 m)  Wt 203 lb 12 oz (92.42 kg)  BMI 30.99 kg/m2 Constitutional: He is oriented to person, place, and time. He appears well-developed and well-nourished. No distress.  HENT: No nasal discharge.  Head: Normocephalic and atraumatic.  Eyes: Pupils are equal and round.  No discharge. Neck: Normal range of motion. Neck supple. No JVD  present. No thyromegaly present.  Cardiovascular: Normal rate, regular rhythm, normal heart sounds. Exam reveals no gallop and no friction rub. There is a 2/6 systolic ejection murmur at the aortic area Pulmonary/Chest: Effort normal and breath sounds normal. No stridor. No respiratory distress. He has no wheezes. He has no rales. He exhibits no tenderness.  Abdominal: Soft. Bowel sounds are normal. He exhibits no distension. There is no tenderness. There is no rebound and no guarding.  Musculoskeletal: Normal range of motion. He exhibits no edema and no tenderness.  Neurological: He is alert and oriented to person, place, and time. Coordination normal.  Skin: Skin is warm and dry. No rash noted. He is not diaphoretic. No erythema. No pallor.  Psychiatric: He has a normal mood and affect. His behavior is normal. Judgment and thought content normal.    ION:GEXBM  Rhythm  -Old inferior infarct.   ABNORMAL     ASSESSMENT AND PLAN

## 2014-09-23 NOTE — Assessment & Plan Note (Signed)
He is tolerating rosuvastatin with no side effects. He had lipid profile done in April with his primary care physician which showed an LDL less than 70.

## 2014-09-23 NOTE — Patient Instructions (Signed)
Medication Instructions: Continue same medications.   Labwork: None.   Procedures/Testing: None.   Follow-Up: 9 months with Dr. Edwina Grossberg  Any Additional Special Instructions Will Be Listed Below (If Applicable).   

## 2015-01-06 ENCOUNTER — Telehealth: Payer: Self-pay | Admitting: *Deleted

## 2015-01-06 ENCOUNTER — Ambulatory Visit (INDEPENDENT_AMBULATORY_CARE_PROVIDER_SITE_OTHER): Payer: Medicare Other

## 2015-01-06 VITALS — BP 130/72 | HR 76 | Ht 68.0 in | Wt 200.0 lb

## 2015-01-06 DIAGNOSIS — R079 Chest pain, unspecified: Secondary | ICD-10-CM | POA: Diagnosis not present

## 2015-01-06 NOTE — Telephone Encounter (Signed)
Pt presented himself in the office stating last night he had a heart attack.  He did not go to ED for he said he self DX himself He denies and SOB and states on a scale 1-10 pain is 1 He would like to get an EKG done He stated last time this happened we needed to put a stent in Advised him we do not have any nurses or Doctors in the office that he would be better going to ED  Stated he would wait and see Will let the nurse or provider ( who ever comes in first) know of this as they come in. Please advise

## 2015-01-06 NOTE — Telephone Encounter (Signed)
EKG performed. Advised by NP. See nurse visit note.

## 2015-01-06 NOTE — Patient Instructions (Signed)
1.) Reason for visit: Pt requesting EKG  2.) Name of MD requesting visit: Pt walk in   3.) H&P: History of MI June 2015, stent placed. "He underwent successful angioplasty and drug-eluting stent placement to the right coronary artery without complications" Pt takes plavix and aspirin  ROS related to problem: Pt presented to the office this morning stating "I had a mild heart attack last night". Reports chest pain/tightness 3/10 from 9pm until 3am accompanied by left arm, neck, and jaw pain. He states he was up most of the night in "the [place]", monitoring s/s.  Pt denies pain at this time. States it resolved after his 4th cup of coffee at 7:30am. He does not want to go to the ER due to the amount of time he would have to wait.  4.) Assessment and plan per MD: EKG performed and given to Ward Givenshris Berge, NP for review. Per Thayer Ohmhris, it is unchanged from 8/25 OV EKG. He states no intervention needed at this time.   Informed pt of Chris's review and advised pt that if he experiences CP, left arm, pain, diaphoresis, nausea, jaw pain or that something just isn't right, call 911 or go directly to the ED.  Pt verbalized understanding and is appreciative of the nurse visit. Pt has no other questions at this time.

## 2015-02-05 ENCOUNTER — Other Ambulatory Visit: Payer: Self-pay | Admitting: Cardiovascular Disease

## 2015-06-10 ENCOUNTER — Other Ambulatory Visit: Payer: Self-pay | Admitting: Cardiovascular Disease

## 2015-06-23 ENCOUNTER — Encounter: Payer: Self-pay | Admitting: Cardiovascular Disease

## 2015-06-23 ENCOUNTER — Ambulatory Visit (INDEPENDENT_AMBULATORY_CARE_PROVIDER_SITE_OTHER): Payer: Medicare Other | Admitting: Cardiovascular Disease

## 2015-06-23 VITALS — BP 142/84 | HR 66 | Ht 68.0 in | Wt 191.5 lb

## 2015-06-23 DIAGNOSIS — E785 Hyperlipidemia, unspecified: Secondary | ICD-10-CM | POA: Diagnosis not present

## 2015-06-23 DIAGNOSIS — I2510 Atherosclerotic heart disease of native coronary artery without angina pectoris: Secondary | ICD-10-CM | POA: Diagnosis not present

## 2015-06-23 DIAGNOSIS — I10 Essential (primary) hypertension: Secondary | ICD-10-CM | POA: Diagnosis not present

## 2015-06-23 NOTE — Progress Notes (Signed)
Cardiology Office Note   Date:  06/23/2015   ID:  Glenn Wilson, DOB 01/03/1936, MRN [id number]  PCP:  Leim FabryALDRIDGE,BARBARA, MD  Cardiologist:   Lorine BearsMuhammad Beauregard Jarrells, MD   Chief Complaint  Patient presents with  . other    9 month f/u no complaints. Meds reviewed verbally.      History of Present Illness: Glenn DaringHarold L Wilson is a 80 y.o. male who presents for a followup visit regarding coronary artery disease . He presented on July 18, 2013 with chest pain and was found to have inferior ST elevation. Emergent cardiac catheterization showed an occluded mid right coronary artery,  60-70% distal LAD stenosis and 80-90% proximal OM stenosis. He underwent successful angioplasty and drug-eluting stent placement to the right coronary artery without complications. Ejection fraction was normal by echocardiogram. He has known history of hypertension and hyperlipidemia with previous intolerance to atorvastatin. He had one episode of neck pain radiating to his left arm in December 2016 which lasted for about 15-20 minutes. It was described as aching and he had no other associated symptoms. He came to our office and had an EKG done which showed no acute changes. He had no recurrent episodes since then and has been doing well overall with no exertional symptoms. He continues to be active. No chest pain or shortness of breath.    Past Medical History  Diagnosis Date  . CAD, multiple vessel, LAD 07/18/2013    Inferior ST elevation myocardial infarction. Cardiac catheterization showed an occluded mid RCA with significant proximal disease, 60-70% distal LAD stenosis and 80-90% OM stenosis. EF was normal by echo. He underwent successful angioplasty and drug-eluting stent placement to the right coronary artery with a 3.5 x 33 mm Xience drug eluting stent postdilated with a 4 mm noncompliant balloon  . Hyperlipidemia LDL goal <70 07/18/2013  . Essential hypertension 07/18/2013  . Compression fracture     Past Surgical  History  Procedure Laterality Date  . Cardiac catheterization    . Replacement total knee      rt  . Left heart catheterization with coronary angiogram N/A 07/18/2013    Procedure: LEFT HEART CATHETERIZATION WITH CORONARY ANGIOGRAM;  Surgeon: Iran OuchMuhammad A Prisilla Kocsis, MD;  Location: [facility];  Service: Cardiovascular;  Laterality: N/A;     Current Outpatient Prescriptions  Medication Sig Dispense Refill  . amLODipine (NORVASC) 5 MG tablet TAKE 1 TABLET EVERY MORNING 90 tablet 3  . aspirin EC 81 MG tablet Take 81 mg by mouth daily.    . clopidogrel (PLAVIX) 75 MG tablet TAKE 1 TABLET DAILY 90 tablet 3  . CRESTOR 10 MG tablet TAKE 1 TABLET DAILY 90 tablet 3  . meloxicam (MOBIC) 7.5 MG tablet Take 7.5 mg by mouth daily.     . metoprolol tartrate (LOPRESSOR) 25 MG tablet Take 25 mg by mouth 2 (two) times daily.    . nitroGLYCERIN (NITROSTAT) 0.4 MG SL tablet Place 1 tablet (0.4 mg total) under the tongue every 5 (five) minutes as needed for chest pain. 25 tablet 2  . potassium chloride SA (K-DUR,KLOR-CON) 20 MEQ tablet Take 20 mEq by mouth daily.    . tamsulosin (FLOMAX) 0.4 MG CAPS capsule Take 0.4 mg by mouth.     No current facility-administered medications for this visit.    Allergies:   Lipitor    Social History:  The patient  reports that he quit smoking about 55 years ago. He has never used smokeless tobacco. He reports that he  does not drink alcohol or use illicit drugs.   Family History:  The patient's family history includes Diabetes type II in his father.    ROS:  Please see the history of present illness.   Otherwise, review of systems are positive for none.   All other systems are reviewed and negative.    PHYSICAL EXAM: VS:  BP 142/84 mmHg  Pulse 66  Ht  (1.727 m)  Wt 191 lb 8 oz (86.864 kg)  BMI 29.12 kg/m2 , BMI Body mass index is 29.12 kg/(m^2). GEN: Well nourished, well developed, in no acute distress HEENT: normal Neck: no JVD, carotid bruits, or  masses Cardiac: RRR; no rubs, or gallops,no edema . There is a 2/6 systolic murmur in the aortic area which is early peaking. Respiratory:  clear to auscultation bilaterally, normal work of breathing GI: soft, nontender, nondistended, + BS MS: no deformity or atrophy Skin: warm and dry, no rash Neuro:  Strength and sensation are intact Psych: euthymic mood, full affect   EKG:  EKG is ordered today. The ekg ordered today demonstrates normal sinus rhythm with old inferior infarct with PACs.   Recent Labs: No results found for requested labs within last 365 days.    Lipid Panel    Component Value Date/Time   CHOL 117 11/09/2013 0739   CHOL 193 07/19/2013 0215   TRIG 167* 11/09/2013 0739   HDL 35* 11/09/2013 0739   HDL 28* 07/19/2013 0215   CHOLHDL 3.3 11/09/2013 0739   CHOLHDL 6.9 07/19/2013 0215   VLDL 56* 07/19/2013 0215   LDLCALC 49 11/09/2013 0739   LDLCALC 109* 07/19/2013 0215      Wt Readings from Last 3 Encounters:  06/23/15 191 lb 8 oz (86.864 kg)  01/06/15 200 lb (90.719 kg)  09/23/14 203 lb 12 oz (92.42 kg)      Other studies Reviewed: Additional studies/ records that were reviewed today include: Labs done by primary care physician..    ASSESSMENT AND PLAN:  1.  Coronary artery disease involving native coroanry arteries without angina: He is doing extremely well with no anginal symptoms. Continue medical therapy. Dual antiplatelet therapies optional at the present time but he is not having any side effects and thus I decided to continue given his residual coronary artery disease.  2. Essential hypertension: Blood pressure is slightly elevated. Continue to monitor.  3. Hyperlipidemia: I reviewed his lipid profile from October 2016 which showed a total cholesterol of 133, triglyceride of 183, HDL of 33 and LDL of 63. Continue treatment with rosuvastatin and work on diet to improve triglyceride.  4. Systolic murmur suggestive of aortic sclerosis or mild  stenosis: Most recent echocardiogram was in 2015 which showed mild aortic regurgitation. I will plan on repeat echocardiogram next year.    Disposition:   FU with me in 1 year  Signed,  [name], MD  06/23/2015 8:13 AM    [facility]

## 2015-06-23 NOTE — Patient Instructions (Signed)
Medication Instructions: Continue same medications.   Labwork: None.   Procedures/Testing: None.   Follow-Up: 1 year with Dr. Arida.   Any Additional Special Instructions Will Be Listed Below (If Applicable).     If you need a refill on your cardiac medications before your next appointment, please call your pharmacy.   

## 2015-07-28 ENCOUNTER — Ambulatory Visit: Payer: Medicare Other | Attending: Specialist

## 2015-08-18 ENCOUNTER — Other Ambulatory Visit: Payer: Self-pay | Admitting: Cardiovascular Disease

## 2015-09-18 ENCOUNTER — Other Ambulatory Visit: Payer: Self-pay | Admitting: Cardiovascular Disease

## 2016-02-02 ENCOUNTER — Other Ambulatory Visit: Payer: Self-pay | Admitting: Cardiovascular Disease

## 2016-06-22 ENCOUNTER — Ambulatory Visit (INDEPENDENT_AMBULATORY_CARE_PROVIDER_SITE_OTHER): Payer: Medicare Other | Admitting: Cardiovascular Disease

## 2016-06-22 ENCOUNTER — Encounter: Payer: Self-pay | Admitting: Cardiovascular Disease

## 2016-06-22 VITALS — BP 128/60 | HR 63 | Ht 68.0 in | Wt 184.5 lb

## 2016-06-22 DIAGNOSIS — R011 Cardiac murmur, unspecified: Secondary | ICD-10-CM

## 2016-06-22 DIAGNOSIS — E785 Hyperlipidemia, unspecified: Secondary | ICD-10-CM

## 2016-06-22 DIAGNOSIS — I2510 Atherosclerotic heart disease of native coronary artery without angina pectoris: Secondary | ICD-10-CM | POA: Diagnosis not present

## 2016-06-22 DIAGNOSIS — G459 Transient cerebral ischemic attack, unspecified: Secondary | ICD-10-CM

## 2016-06-22 DIAGNOSIS — I10 Essential (primary) hypertension: Secondary | ICD-10-CM

## 2016-06-22 NOTE — Progress Notes (Signed)
Cardiology Office Note   Date:  06/22/2016   ID:  EMRAH ARIOLA, DOB [birth year], MRN [id number]  PCP:  [name], MD  Cardiologist:   [name], MD   Chief Complaint  Patient presents with  . other    12 month follow up. Patient c/o about 4 months ago left arm numbness. Meds reviewed verbally with patient.       History of Present Illness: Glenn Wilson is a 81 y.o. male who presents for a followup visit regarding coronary artery disease . He presented on July 18, 2013 with  inferior ST elevation MI. Emergent cardiac catheterization showed an occluded mid right coronary artery,  60-70% distal LAD stenosis and 80-90% proximal OM stenosis. He underwent successful angioplasty and drug-eluting stent placement to the right coronary artery without complications. Ejection fraction was normal by echocardiogram. He has known history of hypertension and hyperlipidemia with previous intolerance to atorvastatin.  About 4-5 months ago, he developed left arm weakness which was relatively sudden and he could not left his arm or pick up things. It was associated with left shoulder pain and left arm numbness. The pain resolved quickly but the weakness persisted for few days. There was no slurred speech or weakness anywhere else. There was no facial droop. The patient did not seek medical attention. Otherwise he denies chest pain, shortness of breath or palpitations. He has been taking his medications regularly.   Past Medical History:  Diagnosis Date  . CAD, multiple vessel, LAD 07/18/2013   Inferior ST elevation myocardial infarction. Cardiac catheterization showed an occluded mid RCA with significant proximal disease, 60-70% distal LAD stenosis and 80-90% OM stenosis. EF was normal by echo. He underwent successful angioplasty and drug-eluting stent placement to the right coronary artery with a 3.5 x 33 mm Xience drug eluting stent postdilated with a 4 mm noncompliant balloon  . Compression  fracture   . Essential hypertension 07/18/2013  . Hyperlipidemia LDL goal <70 07/18/2013    Past Surgical History:  Procedure Laterality Date  . CARDIAC CATHETERIZATION    . LEFT HEART CATHETERIZATION WITH CORONARY ANGIOGRAM N/A 07/18/2013   Procedure: LEFT HEART CATHETERIZATION WITH CORONARY ANGIOGRAM;  Surgeon: Iran Ouch, MD;  Location: [facility];  Service: Cardiovascular;  Laterality: N/A;  . REPLACEMENT TOTAL KNEE     rt     Current Outpatient Prescriptions  Medication Sig Dispense Refill  . amLODipine (NORVASC) 5 MG tablet TAKE 1 TABLET EVERY MORNING 90 tablet 3  . aspirin EC 81 MG tablet Take 81 mg by mouth daily.    . clopidogrel (PLAVIX) 75 MG tablet TAKE 1 TABLET DAILY 90 tablet 3  . meloxicam (MOBIC) 7.5 MG tablet Take 7.5 mg by mouth daily.     . metoprolol tartrate (LOPRESSOR) 25 MG tablet Take 25 mg by mouth 2 (two) times daily.    . nitroGLYCERIN (NITROSTAT) 0.4 MG SL tablet Place 1 tablet (0.4 mg total) under the tongue every 5 (five) minutes as needed for chest pain. 25 tablet 2  . potassium chloride SA (K-DUR,KLOR-CON) 20 MEQ tablet Take 20 mEq by mouth daily.    . rosuvastatin (CRESTOR) 10 MG tablet TAKE 1 TABLET DAILY 90 tablet 1  . tamsulosin (FLOMAX) 0.4 MG CAPS capsule Take 0.4 mg by mouth.     No current facility-administered medications for this visit.     Allergies:   Lipitor [atorvastatin]    Social History:  The patient  reports that he quit smoking  about 56 years ago. He has never used smokeless tobacco. He reports that he does not drink alcohol or use drugs.   Family History:  The patient's family history includes Diabetes type II in his father.    ROS:  Please see the history of present illness.   Otherwise, review of systems are positive for none.   All other systems are reviewed and negative.    PHYSICAL EXAM: VS:  BP 128/60 (BP Location: Left Arm, Patient Position: Sitting, Cuff Size: Normal)   Pulse 63   Ht 5\' 8"  (1.727 m)   Wt 184  lb 8 oz (83.7 kg)   BMI 28.05 kg/m  , BMI Body mass index is 28.05 kg/m. GEN: Well nourished, well developed, in no acute distress  HEENT: normal  Neck: no JVD, carotid bruits, or masses Cardiac: RRR; no rubs, or gallops,no edema . There is a 2/6 systolic murmur in the aortic area which is early peaking. Respiratory:  clear to auscultation bilaterally, normal work of breathing GI: soft, nontender, nondistended, + BS MS: no deformity or atrophy  Skin: warm and dry, no rash Neuro:  Strength and sensation are intact Psych: euthymic mood, full affect   EKG:  EKG is ordered today. The ekg ordered today demonstrates normal sinus rhythm with old inferior infarct.  Recent Labs: No results found for requested labs within last 8760 hours.    Lipid Panel    Component Value Date/Time   CHOL 117 11/09/2013 0739   TRIG 167 (H) 11/09/2013 0739   HDL 35 (L) 11/09/2013 0739   CHOLHDL 3.3 11/09/2013 0739   CHOLHDL 6.9 07/19/2013 0215   VLDL 56 (H) 07/19/2013 0215   LDLCALC 49 11/09/2013 0739      Wt Readings from Last 3 Encounters:  06/22/16 184 lb 8 oz (83.7 kg)  06/23/15 191 lb 8 oz (86.9 kg)  01/06/15 200 lb (90.7 kg)      Other studies Reviewed: Additional studies/ records that were reviewed today include: Labs done by primary care physician..    ASSESSMENT AND PLAN:  1.  Coronary artery disease involving native coroanry arteries without angina:  He is stable from a cardiac standpoint with no anginal symptoms. Continue medical therapy.  2. Possible TIA: Manifested by left arm weakness. This happened about 4 or 5 months ago with minimal residual symptoms at this time. I requested carotid Doppler. I advised him to discuss this with his primary care physician and consider brain MRI if indicated.  3. Systolic murmur suggestive of mild aortic stenosis: No recent evaluation. I requested an echocardiogram.  4. Essential hypertension: Blood pressure is well controlled.  5.  Hyperlipidemia: Continue treatment with rosuvastatin. I reviewed his lipid profile from June of last year which showed an LDL of 45. His triglyceride was mildly elevated at 217.   Disposition:   FU with me in 1 year  Signed,  Lorine BearsMuhammad Arida, MD  06/22/2016 7:59 AM    [facility]

## 2016-06-22 NOTE — Patient Instructions (Addendum)
Medication Instructions:  Your physician recommends that you continue on your current medications as directed. Please refer to the Current Medication list given to you today.   Labwork: none  Testing/Procedures: Your physician has requested that you have an echocardiogram. Echocardiography is a painless test that uses sound waves to create images of your heart. It provides your doctor with information about the size and shape of your heart and how well your heart's chambers and valves are working. This procedure takes approximately one hour. There are no restrictions for this procedure.  Your physician has requested that you have a carotid duplex. This test is an ultrasound of the carotid arteries in your neck. It looks at blood flow through these arteries that supply the brain with blood. Allow one hour for this exam. There are no restrictions or special instructions.    Follow-Up: Your physician wants you to follow-up in: one year with Dr. Kirke CorinArida.  You will receive a reminder letter in the mail two months in advance. If you don't receive a letter, please call our office to schedule the follow-up appointment.   Any Other Special Instructions Will Be Listed Below (If Applicable).     If you need a refill on your cardiac medications before your next appointment, please call your pharmacy.  Echocardiogram An echocardiogram, or echocardiography, uses sound waves (ultrasound) to produce an image of your heart. The echocardiogram is simple, painless, obtained within a short period of time, and offers valuable information to your health care provider. The images from an echocardiogram can provide information such as:  Evidence of coronary artery disease (CAD).  Heart size.  Heart muscle function.  Heart valve function.  Aneurysm detection.  Evidence of a past heart attack.  Fluid buildup around the heart.  Heart muscle thickening.  Assess heart valve function. Tell a health care  provider about:  Any allergies you have.  All medicines you are taking, including vitamins, herbs, eye drops, creams, and over-the-counter medicines.  Any problems you or family members have had with anesthetic medicines.  Any blood disorders you have.  Any surgeries you have had.  Any medical conditions you have.  Whether you are pregnant or may be pregnant. What happens before the procedure? No special preparation is needed. Eat and drink normally. What happens during the procedure?  In order to produce an image of your heart, gel will be applied to your chest and a wand-like tool (transducer) will be moved over your chest. The gel will help transmit the sound waves from the transducer. The sound waves will harmlessly bounce off your heart to allow the heart images to be captured in real-time motion. These images will then be recorded.  You may need an IV to receive a medicine that improves the quality of the pictures. What happens after the procedure? You may return to your normal schedule including diet, activities, and medicines, unless your health care provider tells you otherwise. This information is not intended to replace advice given to you by your health care provider. Make sure you discuss any questions you have with your health care provider. Document Released: 01/13/2000 Document Revised: 09/03/2015 Document Reviewed: 09/22/2012 Elsevier Interactive Patient Education  2017 [employer]

## 2016-08-08 ENCOUNTER — Telehealth: Payer: Self-pay | Admitting: Cardiovascular Disease

## 2016-08-08 NOTE — Telephone Encounter (Signed)
Pt came by to ask whether or not his new Prednisone prescription will effect the results of his tests on 07/17 He does not want to take the prescription until he knows

## 2016-08-08 NOTE — Telephone Encounter (Signed)
Spoke with Randall AnBrittany Strader, PA-C, who advised the prednisone should not affect patient's echo or carotid dopplers for next week.  Patient will be taking a steroid taper given to him by his back doctor to help with pain to see if that works prior to doing potential steroid injections. Patient verbalized understanding that it is ok for him to go ahead and take the prednisone. He was very Adult nurseappreciative.

## 2016-08-14 ENCOUNTER — Ambulatory Visit: Payer: Medicare Other

## 2016-08-14 ENCOUNTER — Other Ambulatory Visit: Payer: Self-pay

## 2016-08-14 ENCOUNTER — Ambulatory Visit (INDEPENDENT_AMBULATORY_CARE_PROVIDER_SITE_OTHER): Payer: Medicare Other

## 2016-08-14 DIAGNOSIS — R011 Cardiac murmur, unspecified: Secondary | ICD-10-CM

## 2016-08-14 DIAGNOSIS — G459 Transient cerebral ischemic attack, unspecified: Secondary | ICD-10-CM | POA: Diagnosis not present

## 2016-08-15 LAB — VAS US CAROTID
LEFT ECA DIAS: 0 cm/s
LEFT VERTEBRAL DIAS: -11 cm/s
Left CCA dist dias: -10 cm/s
Left CCA dist sys: -73 cm/s
Left CCA prox dias: 8 cm/s
Left CCA prox sys: 71 cm/s
Left ICA dist dias: 0 cm/s
Left ICA dist sys: 77 cm/s
Left ICA prox dias: -10 cm/s
Left ICA prox sys: -49 cm/s
RCCADSYS: -50 cm/s
RCCAPDIAS: 11 cm/s
RIGHT ECA DIAS: 0 cm/s
RIGHT VERTEBRAL DIAS: 6 cm/s
Right CCA prox sys: 69 cm/s

## 2016-09-25 ENCOUNTER — Other Ambulatory Visit: Payer: Self-pay | Admitting: Family Medicine

## 2016-09-25 DIAGNOSIS — E041 Nontoxic single thyroid nodule: Secondary | ICD-10-CM

## 2016-09-25 DIAGNOSIS — R932 Abnormal findings on diagnostic imaging of liver and biliary tract: Secondary | ICD-10-CM

## 2016-09-28 ENCOUNTER — Ambulatory Visit
Admission: RE | Admit: 2016-09-28 | Discharge: 2016-09-28 | Disposition: A | Discharge status: Home / Self Care | Payer: Medicare Other | Source: Ambulatory Visit | Attending: Family Medicine | Admitting: Family Medicine

## 2016-09-28 DIAGNOSIS — R932 Abnormal findings on diagnostic imaging of liver and biliary tract: Secondary | ICD-10-CM | POA: Diagnosis present

## 2016-09-28 DIAGNOSIS — E041 Nontoxic single thyroid nodule: Secondary | ICD-10-CM | POA: Diagnosis not present

## 2017-01-18 ENCOUNTER — Other Ambulatory Visit: Payer: Self-pay | Admitting: Specialist

## 2017-01-18 DIAGNOSIS — R0602 Shortness of breath: Secondary | ICD-10-CM

## 2017-01-18 DIAGNOSIS — J8410 Pulmonary fibrosis, unspecified: Secondary | ICD-10-CM

## 2017-01-18 DIAGNOSIS — R918 Other nonspecific abnormal finding of lung field: Secondary | ICD-10-CM

## 2017-01-23 ENCOUNTER — Other Ambulatory Visit: Payer: Self-pay | Admitting: Student

## 2017-01-23 DIAGNOSIS — K769 Liver disease, unspecified: Secondary | ICD-10-CM

## 2017-02-01 ENCOUNTER — Telehealth: Payer: Self-pay | Admitting: Cardiovascular Disease

## 2017-02-01 NOTE — Telephone Encounter (Signed)
Pt states he is having an MRI on his liver and will be using a dye, and asks if this will effect his heart. Please call and advise.

## 2017-02-01 NOTE — Telephone Encounter (Signed)
S/w with patient. He stated he does not think he's had IV contrast or dye before. I advised they should give him the risks and benefits of having the dye prior to the procedure. He verbalized understanding.

## 2017-02-06 ENCOUNTER — Ambulatory Visit
Admission: RE | Admit: 2017-02-06 | Discharge: 2017-02-06 | Disposition: A | Discharge status: Home / Self Care | Payer: Medicare Other | Source: Ambulatory Visit | Attending: Student | Admitting: Student

## 2017-02-06 ENCOUNTER — Other Ambulatory Visit: Payer: Self-pay | Admitting: Student

## 2017-02-06 ENCOUNTER — Ambulatory Visit
Admission: RE | Admit: 2017-02-06 | Discharge: 2017-02-06 | Disposition: A | Discharge status: Home / Self Care | Payer: Medicare Other | Source: Ambulatory Visit | Attending: Specialist | Admitting: Specialist

## 2017-02-06 DIAGNOSIS — K769 Liver disease, unspecified: Secondary | ICD-10-CM

## 2017-02-06 DIAGNOSIS — J8410 Pulmonary fibrosis, unspecified: Secondary | ICD-10-CM

## 2017-02-06 DIAGNOSIS — R0602 Shortness of breath: Secondary | ICD-10-CM | POA: Insufficient documentation

## 2017-02-06 DIAGNOSIS — I2510 Atherosclerotic heart disease of native coronary artery without angina pectoris: Secondary | ICD-10-CM | POA: Insufficient documentation

## 2017-02-06 DIAGNOSIS — J984 Other disorders of lung: Secondary | ICD-10-CM | POA: Insufficient documentation

## 2017-02-06 DIAGNOSIS — K8020 Calculus of gallbladder without cholecystitis without obstruction: Secondary | ICD-10-CM | POA: Insufficient documentation

## 2017-02-06 DIAGNOSIS — R918 Other nonspecific abnormal finding of lung field: Secondary | ICD-10-CM | POA: Insufficient documentation

## 2017-02-06 DIAGNOSIS — I700 Atherosclerosis of aorta: Secondary | ICD-10-CM | POA: Insufficient documentation

## 2017-02-06 DIAGNOSIS — J479 Bronchiectasis, uncomplicated: Secondary | ICD-10-CM | POA: Diagnosis not present

## 2017-02-06 MED ORDER — GADOBENATE DIMEGLUMINE 529 MG/ML IV SOLN
20.0000 mL | Freq: Once | INTRAVENOUS | Status: AC | PRN
  Administered 2017-02-06: 17 mL via INTRAVENOUS

## 2017-09-20 ENCOUNTER — Encounter: Payer: Self-pay | Admitting: Cardiovascular Disease

## 2017-09-20 ENCOUNTER — Ambulatory Visit (INDEPENDENT_AMBULATORY_CARE_PROVIDER_SITE_OTHER): Payer: Medicare Other | Admitting: Cardiovascular Disease

## 2017-09-20 VITALS — BP 118/66 | HR 63 | Ht 68.0 in | Wt 183.0 lb

## 2017-09-20 DIAGNOSIS — I10 Essential (primary) hypertension: Secondary | ICD-10-CM | POA: Diagnosis not present

## 2017-09-20 DIAGNOSIS — E785 Hyperlipidemia, unspecified: Secondary | ICD-10-CM

## 2017-09-20 DIAGNOSIS — I359 Nonrheumatic aortic valve disorder, unspecified: Secondary | ICD-10-CM | POA: Diagnosis not present

## 2017-09-20 DIAGNOSIS — I2510 Atherosclerotic heart disease of native coronary artery without angina pectoris: Secondary | ICD-10-CM

## 2017-09-20 MED ORDER — NITROGLYCERIN 0.4 MG SL SUBL: 0.4000 mg | SUBLINGUAL_TABLET | SUBLINGUAL | 2 refills | Status: AC | PRN

## 2017-09-20 NOTE — Progress Notes (Signed)
Cardiology Office Note   Date:  09/20/2017   ID:  Glenn Wilson, DOB 17-Jul-1935, MRN [id number]  PCP:  [name], MD  Cardiologist:   [name], MD   Chief Complaint  Patient presents with  . Other    12 month follow up. Patient states he has taken nitro a few times for chest pain and has had some swelling in right leg. Meds reviewed verbally with patient.       History of Present Illness: Glenn Wilson is a 82 y.o. male who presents for a followup visit regarding coronary artery disease . He presented on July 18, 2013 with  inferior ST elevation MI. Emergent cardiac catheterization showed an occluded mid right coronary artery,  60-70% distal LAD stenosis and 80-90% proximal OM stenosis. He underwent successful angioplasty and drug-eluting stent placement to the right coronary artery without complications. Ejection fraction was normal by echocardiogram. He has known history of hypertension and hyperlipidemia with previous intolerance to atorvastatin. He had possible TIA last year.  Carotid Doppler showed mild nonobstructive disease bilaterally. He has been doing well with minimal episodes of chest pain but he is able to do his activities without limitations.  No shortness of breath.  Past Medical History:  Diagnosis Date  . CAD, multiple vessel, LAD 07/18/2013   Inferior ST elevation myocardial infarction. Cardiac catheterization showed an occluded mid RCA with significant proximal disease, 60-70% distal LAD stenosis and 80-90% OM stenosis. EF was normal by echo. He underwent successful angioplasty and drug-eluting stent placement to the right coronary artery with a 3.5 x 33 mm Xience drug eluting stent postdilated with a 4 mm noncompliant balloon  . Compression fracture   . Essential hypertension 07/18/2013  . Hyperlipidemia LDL goal <70 07/18/2013    Past Surgical History:  Procedure Laterality Date  . CARDIAC CATHETERIZATION    . LEFT HEART CATHETERIZATION WITH  CORONARY ANGIOGRAM N/A 07/18/2013   Procedure: LEFT HEART CATHETERIZATION WITH CORONARY ANGIOGRAM;  Surgeon: Iran Ouch, MD;  Location: [facility];  Service: Cardiovascular;  Laterality: N/A;  . REPLACEMENT TOTAL KNEE     rt     Current Outpatient Medications  Medication Sig Dispense Refill  . amLODipine (NORVASC) 5 MG tablet TAKE 1 TABLET EVERY MORNING 90 tablet 3  . aspirin EC 81 MG tablet Take 81 mg by mouth daily.    . clopidogrel (PLAVIX) 75 MG tablet TAKE 1 TABLET DAILY 90 tablet 3  . meloxicam (MOBIC) 7.5 MG tablet Take 7.5 mg by mouth daily.     . metoprolol tartrate (LOPRESSOR) 25 MG tablet Take 25 mg by mouth 2 (two) times daily.    . nitroGLYCERIN (NITROSTAT) 0.4 MG SL tablet Place 1 tablet (0.4 mg total) under the tongue every 5 (five) minutes as needed for chest pain. 25 tablet 2  . potassium chloride SA (K-DUR,KLOR-CON) 20 MEQ tablet Take 20 mEq by mouth daily.    . rosuvastatin (CRESTOR) 10 MG tablet TAKE 1 TABLET DAILY 90 tablet 1  . tamsulosin (FLOMAX) 0.4 MG CAPS capsule Take 0.4 mg by mouth.     No current facility-administered medications for this visit.     Allergies:   Lipitor [atorvastatin]    Social History:  The patient  reports that he quit smoking about 57 years ago. He has never used smokeless tobacco. He reports that he does not drink alcohol or use drugs.   Family History:  The patient's family history includes Diabetes type II in  his father.    ROS:  Please see the history of present illness.   Otherwise, review of systems are positive for none.   All other systems are reviewed and negative.    PHYSICAL EXAM: VS:  BP 118/66 (BP Location: Left Arm, Patient Position: Sitting, Cuff Size: Normal)   Pulse 63   Ht 5\' 8"  (1.727 m)   Wt 183 lb (83 kg)   BMI 27.83 kg/m  , BMI Body mass index is 27.83 kg/m. GEN: Well nourished, well developed, in no acute distress  HEENT: normal  Neck: no JVD, carotid bruits, or masses Cardiac: RRR; no rubs, or  gallops,no edema . There is a 2/6 systolic murmur in the aortic area which is early peaking. Respiratory:  clear to auscultation bilaterally, normal work of breathing GI: soft, nontender, nondistended, + BS MS: no deformity or atrophy  Skin: warm and dry, no rash Neuro:  Strength and sensation are intact Psych: euthymic mood, full affect   EKG:  EKG is ordered today. The ekg ordered today demonstrates normal sinus rhythm with old inferior infarct.  Recent Labs: No results found for requested labs within last 8760 hours.    Lipid Panel    Component Value Date/Time   CHOL 117 11/09/2013 0739   TRIG 167 (H) 11/09/2013 0739   HDL 35 (L) 11/09/2013 0739   CHOLHDL 3.3 11/09/2013 0739   CHOLHDL 6.9 07/19/2013 0215   VLDL 56 (H) 07/19/2013 0215   LDLCALC 49 11/09/2013 0739      Wt Readings from Last 3 Encounters:  09/20/17 183 lb (83 kg)  06/22/16 184 lb 8 oz (83.7 kg)  06/23/15 191 lb 8 oz (86.9 kg)      Other studies Reviewed: Additional studies/ records that were reviewed today include: Labs done by primary care physician..    ASSESSMENT AND PLAN:  1.  Coronary artery disease involving native coroanry arteries without angina:  He is stable from a cardiac standpoint with no anginal symptoms.  He has rare episodes of chest pain appears to be stable overall.  It has been more than 3 years since his PCI and drug-eluting stent placement.  Thus, I elected to stop Plavix.  Continue aspirin indefinitely.  2.  Aortic stenosis: This is mild on most recent echocardiogram from last year.  His heart murmur appears to be stable.  I plan on repeat echocardiogram next year.  3. Essential hypertension: Blood pressure is well controlled.  4. Hyperlipidemia: Continue treatment with rosuvastatin.  I reviewed most recent lipid profile done in November which showed a total cholesterol of 125, triglyceride of 160 and an LDL of 55.    Disposition:   FU with me in 1 year  Signed,  Lorine BearsMuhammad  Arida, MD  09/20/2017 10:35 AM    [facility]

## 2017-09-20 NOTE — Patient Instructions (Signed)
Medication Instructions: STOP the plavix   If you need a refill on your cardiac medications before your next appointment, please call your pharmacy.   Follow-Up: Your physician wants you to follow-up in 12 months with Dr. Kirke CorinArida. You will receive a reminder letter in the mail two months in advance. If you don't receive a letter, please call our office at [phone] to schedule this follow-up appointment.   [facility] you [facility]!

## 2017-10-17 ENCOUNTER — Other Ambulatory Visit: Payer: Self-pay | Admitting: Student

## 2017-10-17 DIAGNOSIS — K7460 Unspecified cirrhosis of liver: Secondary | ICD-10-CM

## 2017-10-22 ENCOUNTER — Ambulatory Visit
Admission: RE | Admit: 2017-10-22 | Discharge: 2017-10-22 | Disposition: A | Discharge status: Home / Self Care | Payer: Medicare Other | Source: Ambulatory Visit | Attending: Student | Admitting: Student

## 2017-10-22 DIAGNOSIS — K8020 Calculus of gallbladder without cholecystitis without obstruction: Secondary | ICD-10-CM | POA: Insufficient documentation

## 2017-10-22 DIAGNOSIS — N281 Cyst of kidney, acquired: Secondary | ICD-10-CM | POA: Insufficient documentation

## 2017-10-22 DIAGNOSIS — K7460 Unspecified cirrhosis of liver: Secondary | ICD-10-CM

## 2017-10-22 DIAGNOSIS — D696 Thrombocytopenia, unspecified: Secondary | ICD-10-CM | POA: Diagnosis not present

## 2017-11-27 ENCOUNTER — Telehealth: Payer: Self-pay | Admitting: Cardiovascular Disease

## 2017-11-27 NOTE — Telephone Encounter (Signed)
Clarified with the patient that it is plavix that Dr. Kirke Corin had d/c.

## 2017-11-27 NOTE — Telephone Encounter (Signed)
Please call regarding blood thinner. States he was taken off this medication, but needs to know if he needs to be taking this.

## 2018-03-28 ENCOUNTER — Other Ambulatory Visit: Payer: Self-pay | Admitting: Specialist

## 2018-03-28 ENCOUNTER — Other Ambulatory Visit (HOSPITAL_COMMUNITY): Payer: Self-pay | Admitting: Specialist

## 2018-03-28 DIAGNOSIS — J849 Interstitial pulmonary disease, unspecified: Secondary | ICD-10-CM

## 2018-04-01 ENCOUNTER — Ambulatory Visit
Admission: RE | Admit: 2018-04-01 | Discharge: 2018-04-01 | Disposition: A | Discharge status: Home / Self Care | Payer: Medicare Other | Source: Ambulatory Visit | Attending: Specialist | Admitting: Specialist

## 2018-04-01 ENCOUNTER — Other Ambulatory Visit: Payer: Self-pay

## 2018-04-01 DIAGNOSIS — J849 Interstitial pulmonary disease, unspecified: Secondary | ICD-10-CM | POA: Diagnosis not present

## 2018-07-01 ENCOUNTER — Encounter (INDEPENDENT_AMBULATORY_CARE_PROVIDER_SITE_OTHER): Payer: Self-pay | Admitting: Vascular Surgery

## 2018-07-01 ENCOUNTER — Other Ambulatory Visit: Payer: Self-pay

## 2018-07-01 ENCOUNTER — Ambulatory Visit (INDEPENDENT_AMBULATORY_CARE_PROVIDER_SITE_OTHER): Payer: Medicare Other | Admitting: Vascular Surgery

## 2018-07-01 VITALS — BP 144/76 | HR 88 | Resp 16 | Ht 68.0 in | Wt 186.0 lb

## 2018-07-01 DIAGNOSIS — I739 Peripheral vascular disease, unspecified: Secondary | ICD-10-CM | POA: Insufficient documentation

## 2018-07-01 DIAGNOSIS — Z87891 Personal history of nicotine dependence: Secondary | ICD-10-CM

## 2018-07-01 DIAGNOSIS — I714 Abdominal aortic aneurysm, without rupture, unspecified: Secondary | ICD-10-CM | POA: Insufficient documentation

## 2018-07-01 DIAGNOSIS — I10 Essential (primary) hypertension: Secondary | ICD-10-CM

## 2018-07-01 DIAGNOSIS — I2510 Atherosclerotic heart disease of native coronary artery without angina pectoris: Secondary | ICD-10-CM

## 2018-07-01 DIAGNOSIS — E785 Hyperlipidemia, unspecified: Secondary | ICD-10-CM

## 2018-07-01 DIAGNOSIS — R600 Localized edema: Secondary | ICD-10-CM

## 2018-07-01 DIAGNOSIS — Z79899 Other long term (current) drug therapy: Secondary | ICD-10-CM

## 2018-07-01 DIAGNOSIS — M7989 Other specified soft tissue disorders: Secondary | ICD-10-CM | POA: Insufficient documentation

## 2018-07-01 NOTE — Assessment & Plan Note (Signed)
I have had a long discussion with the patient regarding swelling and why it  causes symptoms.  Patient will begin wearing graduated compression stockings class 1 (20-30 mmHg) on a daily basis. The patient will  beginning wearing the stockings first thing in the morning and removing them in the evening. The patient is instructed specifically not to sleep in the stockings.   In addition, behavioral modification will be initiated.  This will include frequent elevation, use of over the counter pain medications and exercise such as walking.  I have reviewed systemic causes for chronic edema such as liver, kidney and cardiac etiologies.  The patient denies problems with these organ systems.    Consideration for a lymph pump will also be made based upon the effectiveness of conservative therapy.  This would help to improve the edema control and prevent sequela such as ulcers and infections   Patient should undergo duplex ultrasound of the venous system to ensure that DVT or reflux is not present.  The patient will follow-up with me after the ultrasound.   

## 2018-07-01 NOTE — Assessment & Plan Note (Signed)
He reports previously having stents put in his leg.  It is not entirely clear to me if they were put in his heart from a femoral approach or actually in the legs.  No current limb threatening symptoms, but I think noninvasive studies would certainly be reasonable.  ABIs can be done at his next visit.

## 2018-07-01 NOTE — Patient Instructions (Signed)

## 2018-07-01 NOTE — Assessment & Plan Note (Signed)
lipid control important in reducing the progression of atherosclerotic disease. Continue statin therapy  

## 2018-07-01 NOTE — Assessment & Plan Note (Signed)
Follows with cardiology.  No recent anginal symptoms.

## 2018-07-01 NOTE — Assessment & Plan Note (Signed)
blood pressure control important in reducing the progression of atherosclerotic disease. On appropriate oral medications.  

## 2018-07-01 NOTE — Progress Notes (Signed)
Patient ID: Glenn Wilson, male   DOB: 03/23/1935, 83 y.o.   MRN: [id number]  Chief Complaint  Patient presents with   New Patient (Initial Visit)    ref Dayna BarkerAldridge for le edema    HPI Glenn Wilson is a 83 y.o. male.  I am asked to see the patient by Dr. Dayna BarkerAldridge for evaluation of leg swelling.  Patient complains of right leg swelling that has been problematic for several months now.  There is no clear inciting event or causative factor that started the symptoms.  He says he thinks he is now getting a little bit of swelling in his left leg as well.  He reports that it times it swells up twice the size of his left leg.  He denies a previous history of DVT and says he has been checked for that in the past.  He does have a previous history of significant arterial disease and says he has had angioplasty and stents in his legs.  He does not describe typical claudication symptoms at current.  It is not clear the last time that that was checked. In asking him if this was for disease in his heart or his legs, he seems to think it was his legs. He also has a previous history of abdominal aortic aneurysm.  It sounds like [facility] had [facility] and a cardiologist may have checked it, but the last study I see was from about 2015.  He says he has not had any surgery on his aneurysm but he is not entirely sure about that.  He denies any current aneurysm related symptoms. Specifically, the patient denies new back or abdominal pain, or signs of peripheral embolization    Past Medical History:  Diagnosis Date   CAD, multiple vessel, LAD 07/18/2013   Inferior ST elevation myocardial infarction. Cardiac catheterization showed an occluded mid RCA with significant proximal disease, 60-70% distal LAD stenosis and 80-90% OM stenosis. EF was normal by echo. He underwent successful angioplasty and drug-eluting stent placement to the right coronary artery with a 3.5 x 33 mm Xience drug eluting  stent postdilated with a 4 mm noncompliant balloon   Compression fracture    Essential hypertension 07/18/2013   Hyperlipidemia LDL goal <70 07/18/2013  History of TB over fifty years ago while in the service   Past Surgical History:  Procedure Laterality Date   CARDIAC CATHETERIZATION     LEFT HEART CATHETERIZATION WITH CORONARY ANGIOGRAM N/A 07/18/2013   Procedure: LEFT HEART CATHETERIZATION WITH CORONARY ANGIOGRAM;  Surgeon: Iran OuchMuhammad A Arida, MD;  Location: [facility];  Service: Cardiovascular;  Laterality: N/A;   REPLACEMENT TOTAL KNEE     rt    Family History Family History  Problem Relation Age of Onset   Diabetes type II Father   No bleeding disorders, clotting disorders, autoimmune diseases, or aneurysms  Social History Social History   Tobacco Use   Smoking status: Former Smoker    Last attempt to quit: 01/30/1960    Years since quitting: 58.4   Smokeless tobacco: Never Used  Substance Use Topics   Alcohol use: No   Drug use: No    Allergies  Allergen Reactions   Lipitor [Atorvastatin] Other (See Comments)    Muscle aches severe     Current Outpatient Medications  Medication Sig Dispense Refill   amLODipine (NORVASC) 5 MG tablet TAKE 1 TABLET EVERY MORNING 90 tablet 3   aspirin EC  81 MG tablet Take 81 mg by mouth daily.     meloxicam (MOBIC) 7.5 MG tablet Take 7.5 mg by mouth daily.      metoprolol tartrate (LOPRESSOR) 25 MG tablet Take 25 mg by mouth 2 (two) times daily.     nitroGLYCERIN (NITROSTAT) 0.4 MG SL tablet Place 1 tablet (0.4 mg total) under the tongue every 5 (five) minutes as needed for chest pain. 25 tablet 2   potassium chloride SA (K-DUR,KLOR-CON) 20 MEQ tablet Take 20 mEq by mouth daily.     rosuvastatin (CRESTOR) 10 MG tablet TAKE 1 TABLET DAILY 90 tablet 1   tamsulosin (FLOMAX) 0.4 MG CAPS capsule Take 0.4 mg by mouth.     No current facility-administered medications for this visit.       REVIEW OF SYSTEMS (Negative  unless checked)  Constitutional: [] Weight loss  [] Fever  [] Chills Cardiac: [] Chest pain   [] Chest pressure   [] Palpitations   [] Shortness of breath when laying flat   [] Shortness of breath at rest   [] Shortness of breath with exertion. Vascular:  [x] Pain in legs with walking   [] Pain in legs at rest   [] Pain in legs when laying flat   [] Claudication   [] Pain in feet when walking  [] Pain in feet at rest  [] Pain in feet when laying flat   [] History of DVT   [] Phlebitis   [x] Swelling in legs   [x] Varicose veins   [] Non-healing ulcers Pulmonary:   [] Uses home oxygen   [] Productive cough   [] Hemoptysis   [] Wheeze  [] COPD   [] Asthma Neurologic:  [] Dizziness  [] Blackouts   [] Seizures   [] History of stroke   [] History of TIA  [] Aphasia   [] Temporary blindness   [] Dysphagia   [] Weakness or numbness in arms   [] Weakness or numbness in legs Musculoskeletal:  [x] Arthritis   [] Joint swelling   [x] Joint pain   [x] Low back pain Hematologic:  [] Easy bruising  [] Easy bleeding   [] Hypercoagulable state   [] Anemic  [] Hepatitis Gastrointestinal:  [] Blood in stool   [] Vomiting blood  [] Gastroesophageal reflux/heartburn   [] Abdominal pain Genitourinary:  [] Chronic kidney disease   [] Difficult urination  [] Frequent urination  [] Burning with urination   [] Hematuria Skin:  [] Rashes   [] Ulcers   [] Wounds Psychological:  [] History of anxiety   []  History of major depression.    Physical Exam BP (!) 144/76 (BP Location: Right Arm)    Pulse 88    Resp 16    Ht 5\' 8"  (1.727 m)    Wt 186 lb (84.4 kg)    BMI 28.28 kg/m  Gen:  WD/WN, NAD. Appears younger than stated age. Head: [place]/AT, No temporalis wasting.  Ear/Nose/Throat: Hearing grossly intact, nares w/o erythema or drainage, oropharynx w/o Erythema/Exudate Eyes: Conjunctiva clear, sclera non-icteric  Neck: trachea midline.  No JVD.  Pulmonary:  Good air movement, respirations not labored, no use of accessory muscles  Cardiac: RRR, no JVD Vascular:  Vessel Right Left    Radial Palpable Palpable                          DP 1+ 2+  PT 1+ 1+   Gastrointestinal:. No masses, surgical incisions, or scars. Aorta not easily palpable Musculoskeletal: M/S 5/5 throughout.  Extremities without ischemic changes.  No deformity or atrophy. Trace RLE edema, no appreciable LLE edema. Neurologic: Sensation grossly intact in extremities.  Symmetrical.  Speech is fluent. Motor exam as listed above. Psychiatric: Judgment seems intact  but he is not a great historian, Mood & affect appropriate for pt's clinical situation. Dermatologic: No rashes or ulcers noted.  No cellulitis or open wounds.    Radiology No results found.  Labs No results found for this or any previous visit (from the past 2160 hour(s)).  Assessment/Plan:  Hyperlipidemia LDL goal <70 lipid control important in reducing the progression of atherosclerotic disease. Continue statin therapy   HTN (hypertension) blood pressure control important in reducing the progression of atherosclerotic disease. On appropriate oral medications.   CAD, multiple vessel, LAD Follows with cardiology.  No recent anginal symptoms.  AAA (abdominal aortic aneurysm) without rupture [facility]) He has a reported history of an aneurysm.  I do not see his study since 2015.  His cardiologist notes are in the system and the last vascular study I see was a carotid done in 2018.  I actually do not see any aneurysm studies.  Because of the potential lethal nature of the abdominal aortic aneurysms even in asymptomatic patient, I think performing a duplex at his next visit would be prudent.  PAD (peripheral artery disease) (HCC) He reports previously having stents put in his leg.  It is not entirely clear to me if they were put in his heart from a femoral approach or actually in the legs.  No current limb threatening symptoms, but I think noninvasive studies would certainly be reasonable.  ABIs can be done at his next visit.  Swelling  of limb I have had a long discussion with the patient regarding swelling and why it  causes symptoms.  Patient will begin wearing graduated compression stockings class 1 (20-30 mmHg) on a daily basis. The patient will  beginning wearing the stockings first thing in the morning and removing them in the evening. The patient is instructed specifically not to sleep in the stockings.  In addition, behavioral modification will be initiated.  This will include frequent elevation, use of over the counter pain medications and exercise such as walking. I have reviewed systemic causes for chronic edema such as liver, kidney and cardiac etiologies.  The patient denies problems with these organ systems.   Consideration for a lymph pump will also be made based upon the effectiveness of conservative therapy.  This would help to improve the edema control and prevent sequela such as ulcers and infections  Patient should undergo duplex ultrasound of the venous system to ensure that DVT or reflux is not present. The patient will follow-up with me after the ultrasound.        Festus Barren 07/01/2018, 3:13 PM   This note was created with Dragon medical transcription system.  Any errors from dictation are unintentional.

## 2018-07-01 NOTE — Assessment & Plan Note (Signed)
He has a reported history of an aneurysm.  I do not see his study since 2015.  His cardiologist notes are in the system and the last vascular study I see was a carotid done in 2018.  I actually do not see any aneurysm studies.  Because of the potential lethal nature of the abdominal aortic aneurysms even in asymptomatic patient, I think performing a duplex at his next visit would be prudent.

## 2018-08-06 ENCOUNTER — Ambulatory Visit (INDEPENDENT_AMBULATORY_CARE_PROVIDER_SITE_OTHER): Payer: Medicare Other

## 2018-08-06 ENCOUNTER — Ambulatory Visit (INDEPENDENT_AMBULATORY_CARE_PROVIDER_SITE_OTHER): Payer: Medicare Other | Admitting: Nurse Practitioner

## 2018-08-06 ENCOUNTER — Encounter (INDEPENDENT_AMBULATORY_CARE_PROVIDER_SITE_OTHER): Payer: Self-pay | Admitting: Nurse Practitioner

## 2018-08-06 ENCOUNTER — Other Ambulatory Visit: Payer: Self-pay

## 2018-08-06 VITALS — BP 162/92 | HR 123 | Resp 14 | Ht 68.0 in | Wt 185.0 lb

## 2018-08-06 DIAGNOSIS — Z136 Encounter for screening for cardiovascular disorders: Secondary | ICD-10-CM

## 2018-08-06 DIAGNOSIS — R600 Localized edema: Secondary | ICD-10-CM

## 2018-08-06 DIAGNOSIS — Z87891 Personal history of nicotine dependence: Secondary | ICD-10-CM

## 2018-08-06 DIAGNOSIS — I739 Peripheral vascular disease, unspecified: Secondary | ICD-10-CM

## 2018-08-06 DIAGNOSIS — I10 Essential (primary) hypertension: Secondary | ICD-10-CM | POA: Diagnosis not present

## 2018-08-06 DIAGNOSIS — I714 Abdominal aortic aneurysm, without rupture, unspecified: Secondary | ICD-10-CM

## 2018-08-06 DIAGNOSIS — M7989 Other specified soft tissue disorders: Secondary | ICD-10-CM

## 2018-08-06 DIAGNOSIS — I872 Venous insufficiency (chronic) (peripheral): Secondary | ICD-10-CM

## 2018-08-06 DIAGNOSIS — Z79899 Other long term (current) drug therapy: Secondary | ICD-10-CM | POA: Diagnosis not present

## 2018-08-10 ENCOUNTER — Encounter (INDEPENDENT_AMBULATORY_CARE_PROVIDER_SITE_OTHER): Payer: Self-pay | Admitting: Nurse Practitioner

## 2018-08-10 DIAGNOSIS — I872 Venous insufficiency (chronic) (peripheral): Secondary | ICD-10-CM | POA: Insufficient documentation

## 2018-08-10 NOTE — Progress Notes (Signed)
SUBJECTIVE:  Patient ID: Glenn Wilson, male    DOB: December 28, 1935, 83 y.o.   MRN: [id number] Chief Complaint  Patient presents with  . Follow-up    HPI  Glenn Wilson is a 83 y.o. male that is following up for lower extremity swelling.  Previously he stated that his left lower extremity swelled to twice the size of his right but iy has currently resolved with elevation.  He also states that he has a stent but is unclear whether it is in his heart or his it was just done via femoral approach.  He also has a reported history of AAA but unsure of measurements.  He denies any pain, chills, nausea or vomiting.  He denies any claudication or rest pain.  He denies back pain or abdominal masses.  Non invasive studies show no evidence of DVT of SVT bilaterally. Reflux seen in right femoral vein.    Abdominal ultrasound reveals aorta of 2.0 cm with little atherosclerosis.     Triphasic wave forms in the bilateral tibial arteries with ABIs of 1.27 of the right and 1.29 on left.   Past Medical History:  Diagnosis Date  . CAD, multiple vessel, LAD 07/18/2013   Inferior ST elevation myocardial infarction. Cardiac catheterization showed an occluded mid RCA with significant proximal disease, 60-70% distal LAD stenosis and 80-90% OM stenosis. EF was normal by echo. He underwent successful angioplasty and drug-eluting stent placement to the right coronary artery with a 3.5 x 33 mm Xience drug eluting stent postdilated with a 4 mm noncompliant balloon  . Compression fracture   . Essential hypertension 07/18/2013  . Hyperlipidemia LDL goal <70 07/18/2013    Past Surgical History:  Procedure Laterality Date  . CARDIAC CATHETERIZATION    . LEFT HEART CATHETERIZATION WITH CORONARY ANGIOGRAM N/A 07/18/2013   Procedure: LEFT HEART CATHETERIZATION WITH CORONARY ANGIOGRAM;  Surgeon: Wellington Hampshire, MD;  Location: [facility];  Service: Cardiovascular;  Laterality: N/A;  . REPLACEMENT TOTAL KNEE     rt     Social History   Socioeconomic History  . Marital status: Married    Spouse name: Not on file  . Number of children: Not on file  . Years of education: Not on file  . Highest education level: Not on file  Occupational History  . Not on file  Social Needs  . Financial resource strain: Not on file  . Food insecurity    Worry: Not on file    Inability: Not on file  . Transportation needs    Medical: Not on file    Non-medical: Not on file  Tobacco Use  . Smoking status: Former Smoker    Quit date: 01/30/1960    Years since quitting: 58.5  . Smokeless tobacco: Never Used  Substance and Sexual Activity  . Alcohol use: No  . Drug use: No  . Sexual activity: Not on file  Lifestyle  . Physical activity    Days per week: Not on file    Minutes per session: Not on file  . Stress: Not on file  Relationships  . Social Herbalist on phone: Not on file    Gets together: Not on file    Attends religious service: Not on file    Active member of club or organization: Not on file    Attends meetings of clubs or organizations: Not on file    Relationship status: Not on file  . Intimate partner violence  Fear of current or ex partner: Not on file    Emotionally abused: Not on file    Physically abused: Not on file    Forced sexual activity: Not on file  Other Topics Concern  . Not on file  Social History Narrative  . Not on file    Family History  Problem Relation Age of Onset  . Diabetes type II Father     Allergies  Allergen Reactions  . Lipitor [Atorvastatin] Other (See Comments)    Muscle aches severe      Review of Systems   Review of Systems: Negative Unless Checked Constitutional: [] Weight loss  [] Fever  [] Chills Cardiac: [] Chest pain   []  Atrial Fibrillation  [] Palpitations   [] Shortness of breath when laying flat   [] Shortness of breath with exertion. [] Shortness of breath at rest Vascular:  [] Pain in legs with walking   [] Pain in legs with standing  [] Pain in legs when laying flat   [] Claudication    [] Pain in feet when laying flat    [] History of DVT   [] Phlebitis   [x] Swelling in legs   [] Varicose veins   [] Non-healing ulcers Pulmonary:   [] Uses home oxygen   [] Productive cough   [] Hemoptysis   [] Wheeze  [] COPD   [] Asthma Neurologic:  [] Dizziness   [] Seizures  [] Blackouts [] History of stroke   [] History of TIA  [] Aphasia   [] Temporary Blindness   [] Weakness or numbness in arm   [] Weakness or numbness in leg Musculoskeletal:   [] Joint swelling   [] Joint pain   [] Low back pain  []  History of Knee Replacement [] Arthritis [] back Surgeries  []  Spinal Stenosis    Hematologic:  [] Easy bruising  [] Easy bleeding   [] Hypercoagulable state   [] Anemic Gastrointestinal:  [] Diarrhea   [] Vomiting  [] Gastroesophageal reflux/heartburn   [] Difficulty swallowing. [] Abdominal pain Genitourinary:  [] Chronic kidney disease   [] Difficult urination  [] Anuric   [] Blood in urine [] Frequent urination  [] Burning with urination   [] Hematuria Skin:  [] Rashes   [] Ulcers [] Wounds Psychological:  [] History of anxiety   []  History of major depression  []  Memory Difficulties      OBJECTIVE:   Physical Exam  BP (!) 162/92 (BP Location: Left Arm, Patient Position: Sitting, Cuff Size: Normal)   Pulse (!) 123   Resp 14   Ht 5\' 8"  (1.727 m)   Wt 185 lb (83.9 kg)   BMI 28.13 kg/m   Gen: WD/WN, NAD Head: [place]/AT, No temporalis wasting.  Ear/Nose/Throat: Hearing grossly intact, nares w/o erythema or drainage Eyes: PER, EOMI, sclera nonicteric.  Neck: Supple, no masses.  No JVD.  Pulmonary:  Good air movement, no use of accessory muscles.  Cardiac: RRR Vascular: 1+ edema bilaterally Vessel Right Left  Radial Palpable Palpable  Dorsalis Pedis Palpable Palpable  Posterior Tibial Palpable Palpable   Gastrointestinal: soft, non-distended. No guarding/no peritoneal signs.  Musculoskeletal: M/S 5/5 throughout.  No deformity or atrophy.  Neurologic: Pain and light touch  intact in extremities.  Symmetrical.  Speech is fluent. Motor exam as listed above. Psychiatric: Judgment intact, Mood & affect appropriate for pt's clinical situation. Dermatologic: No Venous rashes. No Ulcers Noted.  No changes consistent with cellulitis. Lymph : No Cervical lymphadenopathy, no lichenification or skin changes of chronic lymphedema.       ASSESSMENT AND PLAN:  1. AAA (abdominal aortic aneurysm) without rupture (HCC) No evidence of AAA with largest measurement at 2.0 cm.  Will follow PRN  2. Essential hypertension Continue antihypertensive medications as already  ordered, these medications have been reviewed and there are no changes at this time.   3. Chronic venous insufficiency of lower extremity Recommend:  The patient is complaining of varicose veins.    I have had a long discussion with the patient regarding  varicose veins and why they cause symptoms.  Patient will begin wearing graduated compression stockings on a daily basis, beginning first thing in the morning and removing them in the evening. The patient is instructed specifically not to sleep in the stockings.    The patient  will also begin using over-the-counter analgesics such as Motrin 600 mg po TID to help control the symptoms as needed.    In addition, behavioral modification including elevation during the day will be initiated, utilizing a recliner was recommended.  The patient is also instructed to continue exercising such as walking 4-5 times per week.  At this time the patient wishes to continue conservative therapy.   4. PAD (peripheral artery disease) (HCC) No evidence of PAD.  ABIs are within normal limits with triphasic waveforms.  Strong triphasic waveforms bilaterally.     The Patient will follow up in six months  Current Outpatient Medications on File Prior to Visit  Medication Sig Dispense Refill  . amLODipine (NORVASC) 5 MG tablet TAKE 1 TABLET EVERY MORNING 90 tablet 3  . aspirin  EC 81 MG tablet Take 81 mg by mouth daily.    . meloxicam (MOBIC) 7.5 MG tablet Take 7.5 mg by mouth daily.     . metoprolol tartrate (LOPRESSOR) 25 MG tablet Take 25 mg by mouth 2 (two) times daily.    . nitroGLYCERIN (NITROSTAT) 0.4 MG SL tablet Place 1 tablet (0.4 mg total) under the tongue every 5 (five) minutes as needed for chest pain. 25 tablet 2  . potassium chloride SA (K-DUR,KLOR-CON) 20 MEQ tablet Take 20 mEq by mouth daily.    . rosuvastatin (CRESTOR) 10 MG tablet TAKE 1 TABLET DAILY 90 tablet 1  . tamsulosin (FLOMAX) 0.4 MG CAPS capsule Take 0.4 mg by mouth.    . Vitamin D, Ergocalciferol, (DRISDOL) 1.25 MG (50000 UT) CAPS capsule      No current facility-administered medications on file prior to visit.     There are no Patient Instructions on file for this visit. No follow-ups on file.   Georgiana SpinnerFallon E Franchelle Foskett, NP  This note was completed with Office managerDragon Dictation.  Any errors are purely unintentional.

## 2018-08-18 ENCOUNTER — Encounter (INDEPENDENT_AMBULATORY_CARE_PROVIDER_SITE_OTHER): Payer: Medicare Other

## 2018-08-18 ENCOUNTER — Ambulatory Visit (INDEPENDENT_AMBULATORY_CARE_PROVIDER_SITE_OTHER): Payer: Medicare Other | Admitting: Nurse Practitioner

## 2018-08-18 ENCOUNTER — Other Ambulatory Visit (INDEPENDENT_AMBULATORY_CARE_PROVIDER_SITE_OTHER): Payer: Medicare Other

## 2019-02-10 ENCOUNTER — Ambulatory Visit (INDEPENDENT_AMBULATORY_CARE_PROVIDER_SITE_OTHER): Payer: Medicare Other | Admitting: Vascular Surgery

## 2019-05-22 IMAGING — US US ABDOMEN COMPLETE
1 series · 13 of 25 positions shown · non-contrast
Comparison: 02/06/2017 MR.

CLINICAL DATA: 82-year-old male with cirrhosis. Subsequent
encounter.

EXAM:
ABDOMEN ULTRASOUND COMPLETE

[Series 1: us abdomen complete · 134 acquisitions, 13 frames shown]
[im 1/134]
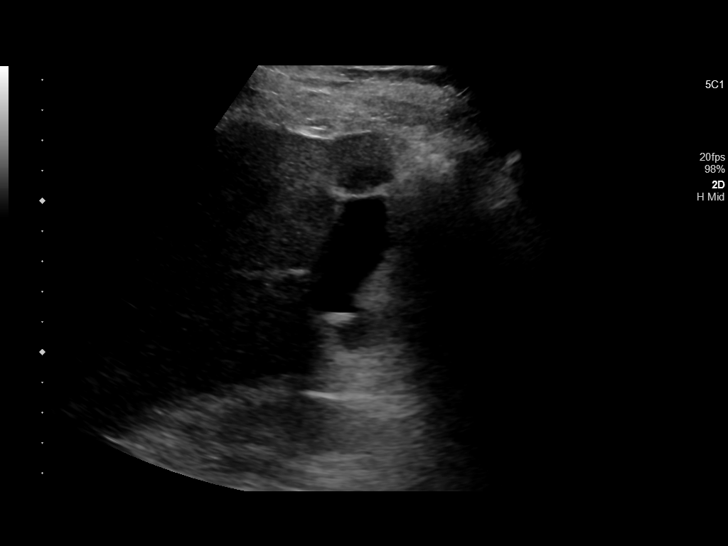
[im 12/134]
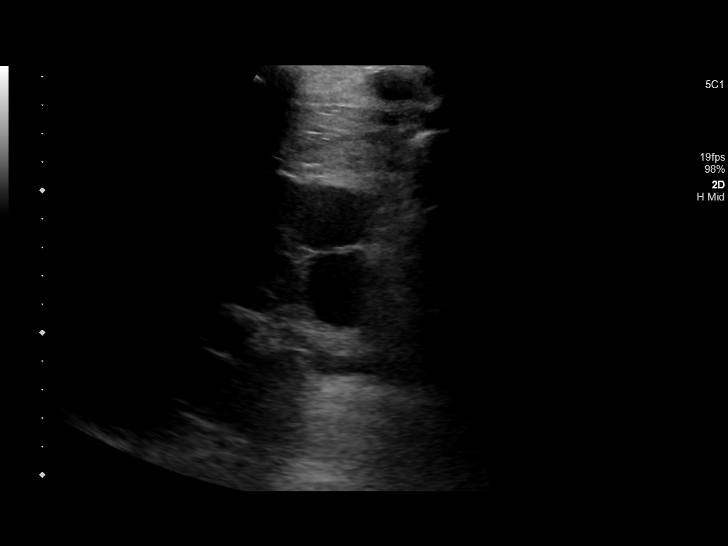
[im 23/134]
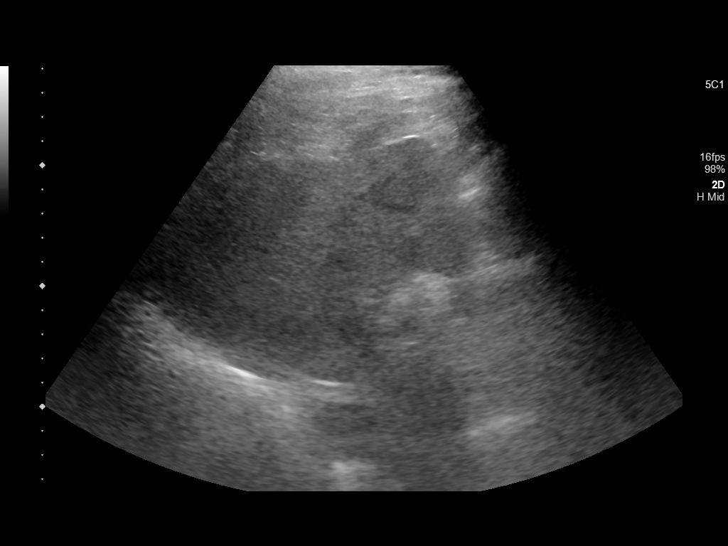
[im 34/134]
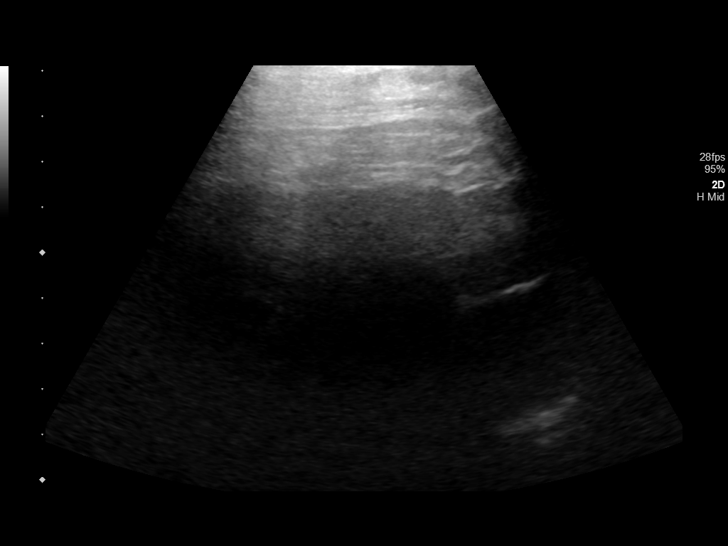
[im 45/134]
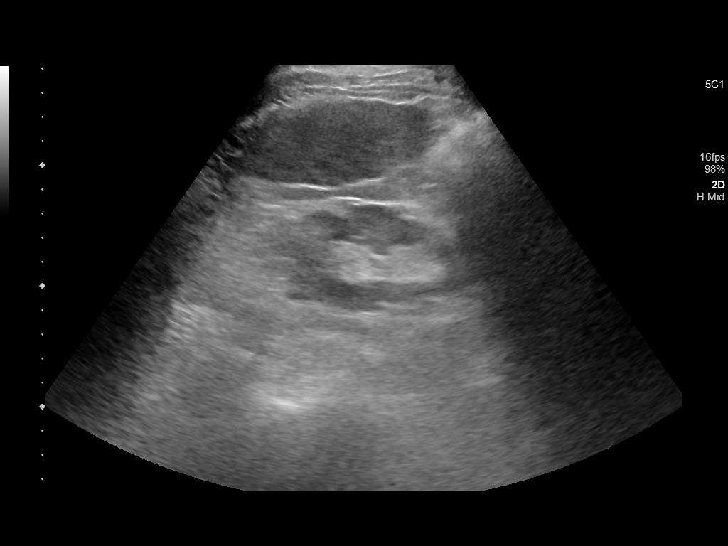
[im 56/134]
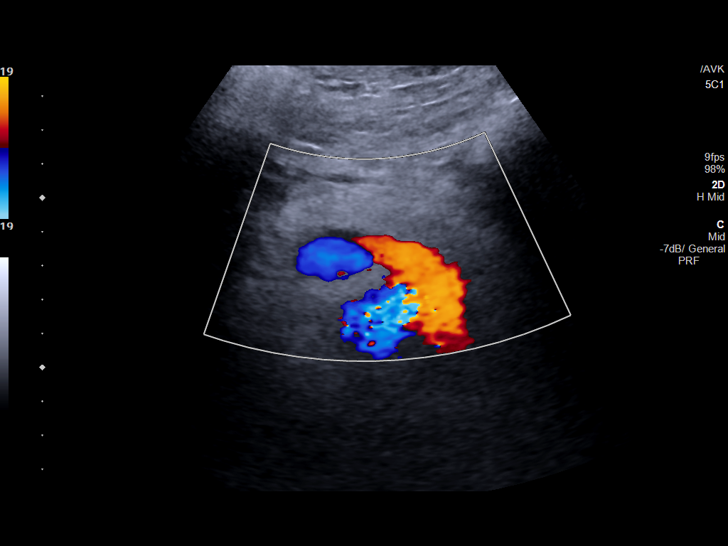
[im 67/134]
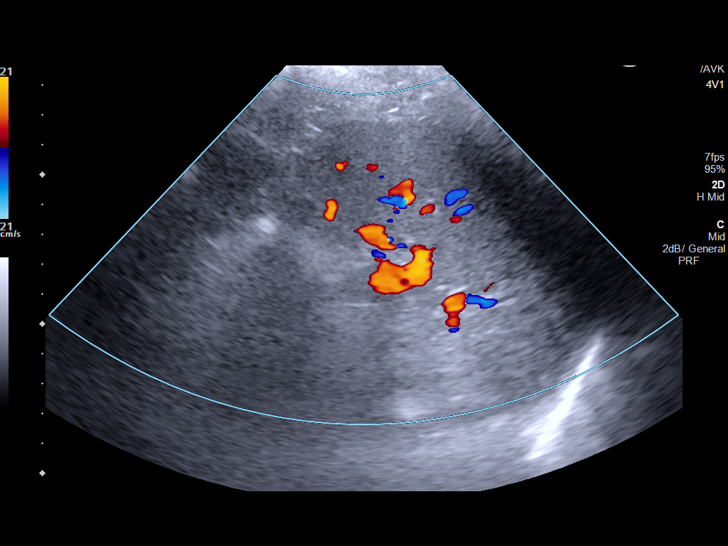
[im 78/134]
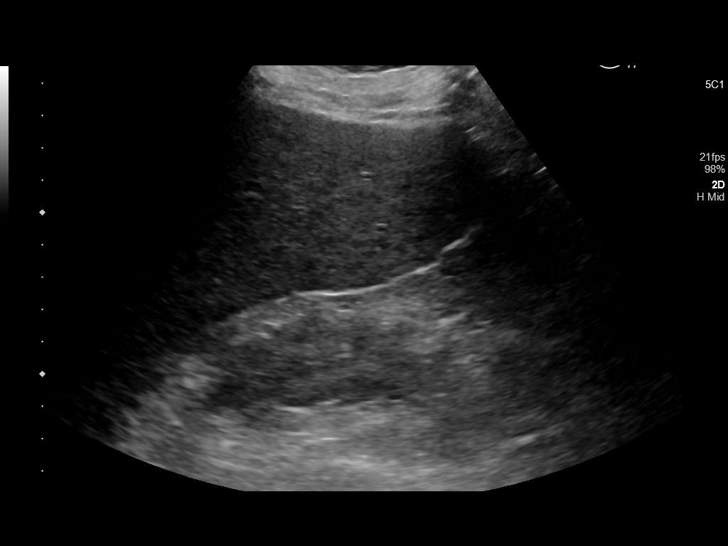
[im 89/134]
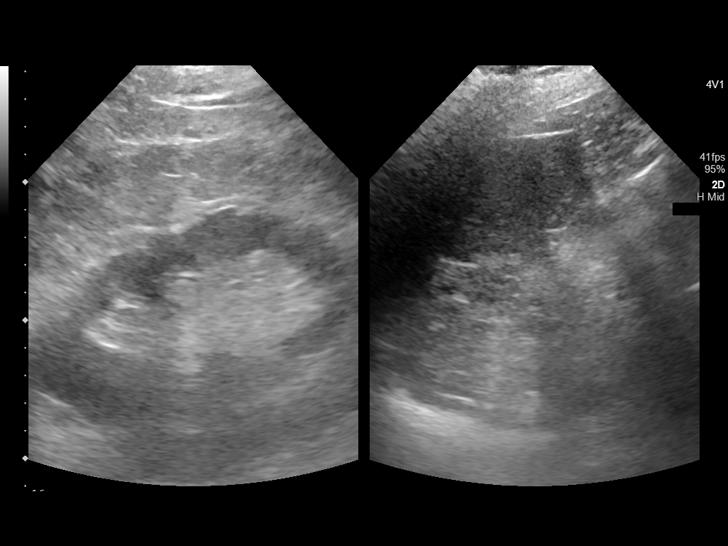
[im 100/134]
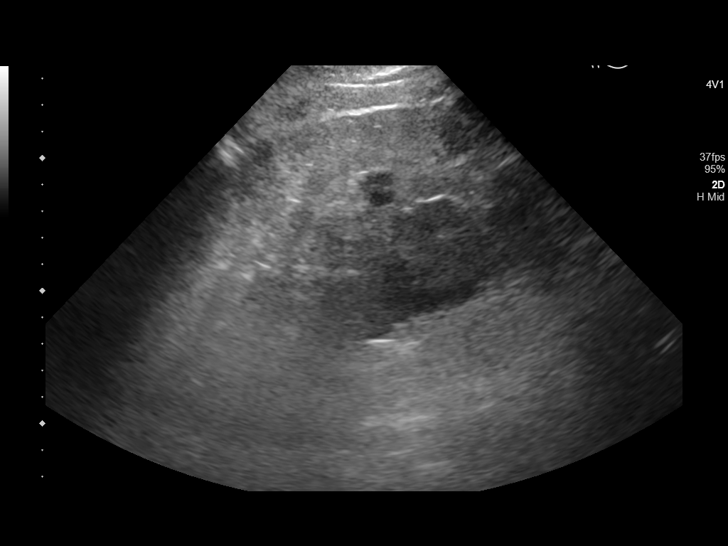
[im 111/134]
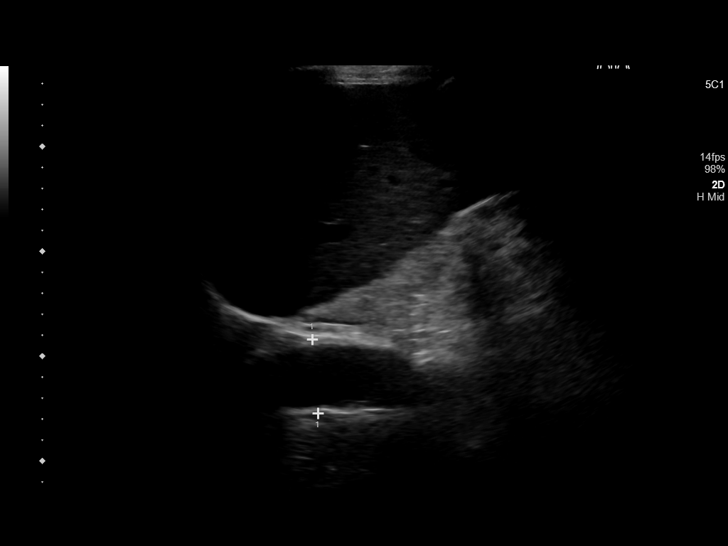
[im 122/134]
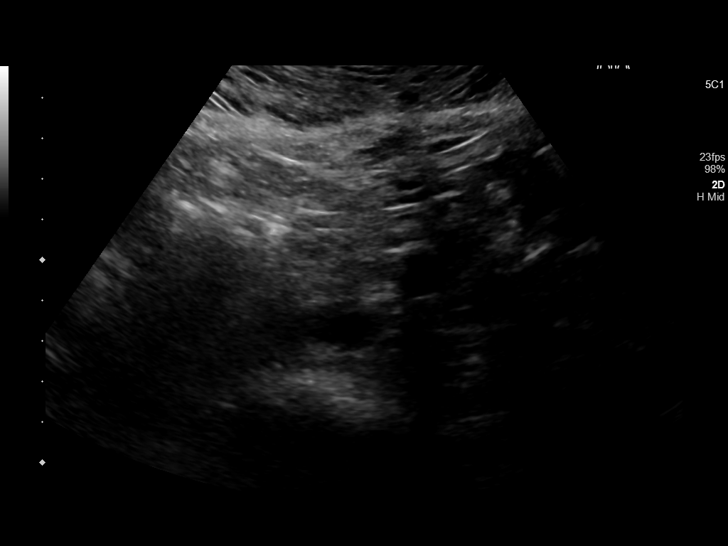
[im 134/134]
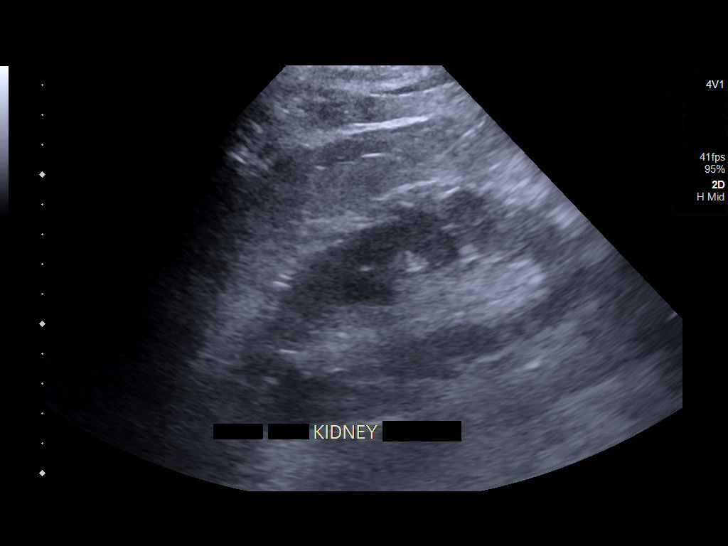

[13 of 25 positions shown; findings below may reference images not displayed]

FINDINGS: Gallbladder: 1 cm gallstone. No gallbladder wall thickening. Patient
not tender over the gallbladder during scanning.

Common bile duct: Diameter: 6 mm.

Liver: Nodular heterogeneous liver of increased echogenicity
consistent with cirrhosis. Difficult to penetrate. No obvious mass.
Portal vein is patent on color Doppler imaging with normal direction
of blood flow towards the liver.

IVC: No abnormality visualized.

Pancreas: Suboptimally evaluated secondary to habitus and bowel gas.
Portions visualized unremarkable.

Spleen: Size and appearance within normal limits.

Right Kidney: Length: 11.9 cm. Echogenicity within normal limits. No
hydronephrosis visualized. MR detected cysts not appreciated.

Left Kidney: Length: 13.1 cm. Echogenicity within normal limits. No
hydronephrosis visualized. Left renal cysts largest measuring up to
3.4 cm peers minimally complex.

Abdominal aorta: Proximal aorta appears prominent measuring up to
3.5 cm. This degree of aortic dilation not appreciated on recent CT.

Other findings: None.
IMPRESSION: 1. Nodular heterogeneous liver of increased echogenicity consistent
with cirrhosis. It was difficult to penetrate the liver. No obvious
hepatic lesion.
2. 1 cm gallstone without evidence of gallbladder inflammation.
3. Current exam suggests proximal aorta is dilated to 3.5 cm. This
is not appreciated on the prior MR. Recommend followup by ultrasound
in 2 years. This recommendation follows ACR consensus guidelines:
White Paper of the ACR Incidental Findings Committee II on Vascular
Findings. [HOSPITAL] 5087; [DATE].
4. Left renal cysts measure up to 3.4 cm with largest cyst appearing
slightly complex. MR detected right renal cysts not appreciated by
the present ultrasound.
5. Limited evaluation of pancreas secondary to bowel gas. The
portions which are visualized appear unremarkable.

## 2019-10-30 IMAGING — CT CT CHEST HIGH RESOLUTION W/O CM
2 of 7 series · 13 of 36 positions shown, 16 images · non-contrast
Comparison: CT of the chest 02/06/2017.

CLINICAL DATA: 82-year-old male with history of shortness of
breath. History of COPD. Right leg swelling for the past 6 months.

EXAM:
CT CHEST WITHOUT CONTRAST
TECHNIQUE: Multidetector CT imaging of the chest was performed following the
standard protocol without intravenous contrast. High resolution
imaging of the lungs, as well as inspiratory and expiratory imaging,
was performed.

[Series 4: thorax · coronal · 0.64mm/px · 3 of 170 slices shown]
[im 34/170  lung]
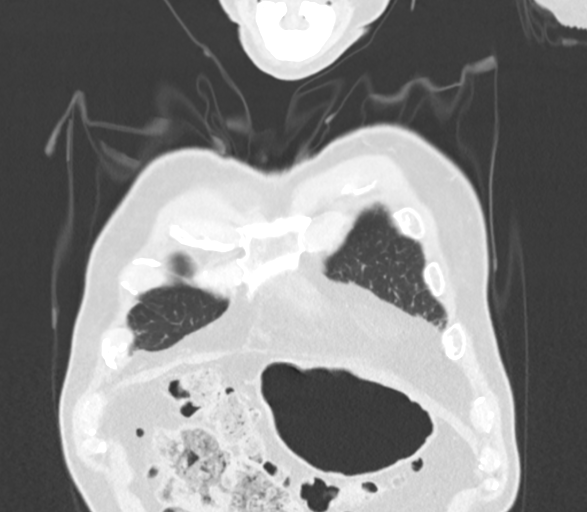
[im 68/170  lung]
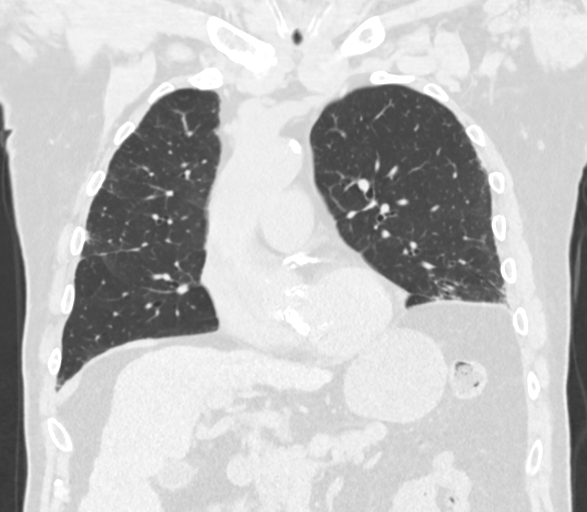
[im 102/170  lung]
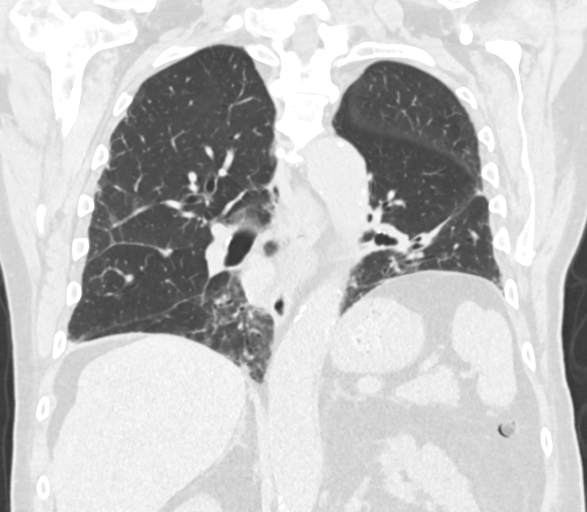

[Series 11: high res (id) thorax · axial · 0.67mm/px · z∈[-1232,-957]mm · 10 of 327 slices shown, 13 images]
[im 26/327  mediastinal]
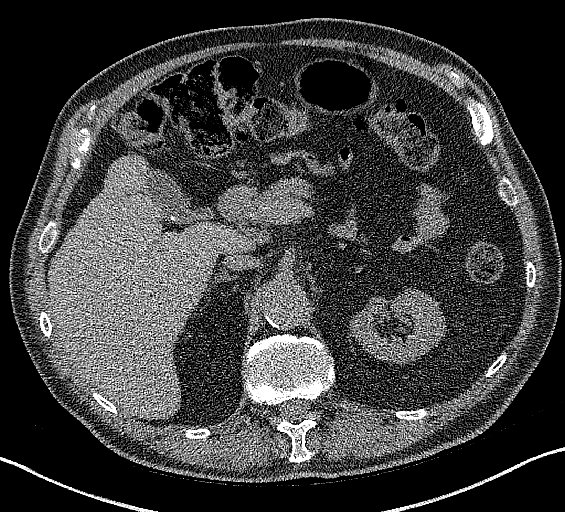
[im 26/327  lung]
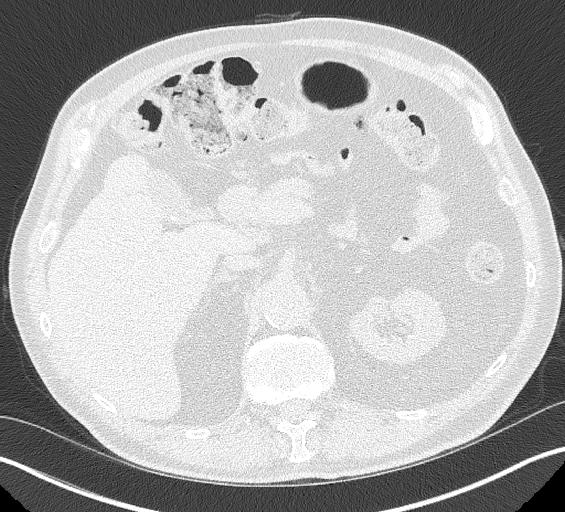
[im 51/327  lung]
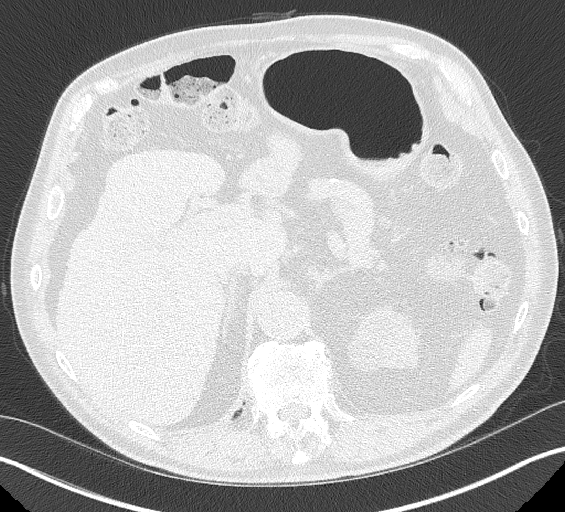
[im 101/327  lung]
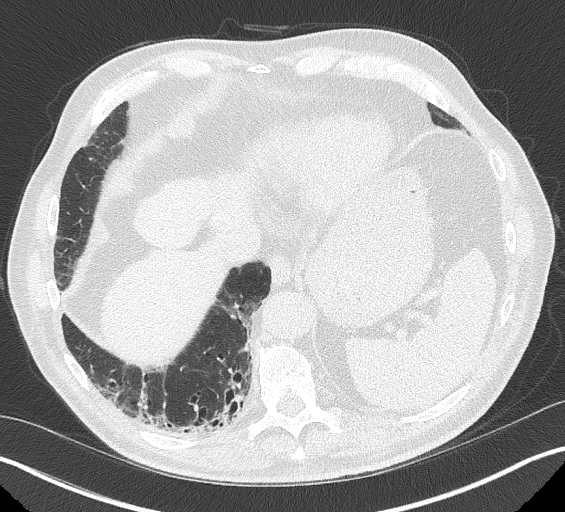
[im 126/327  lung]
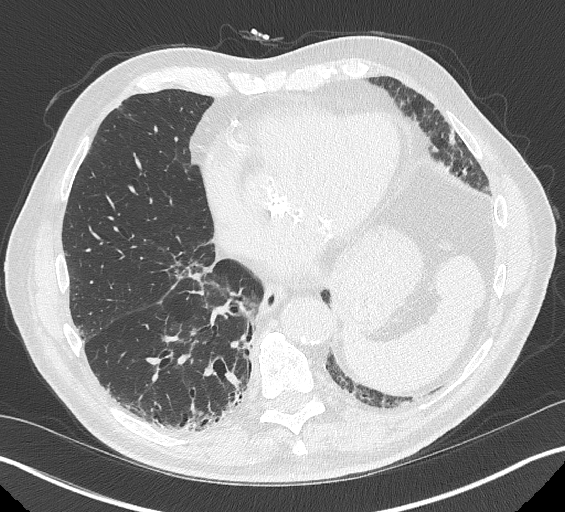
[im 151/327  mediastinal]
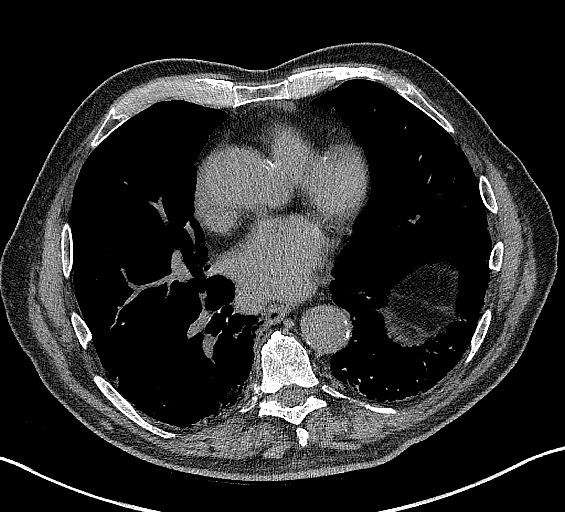
[im 151/327  lung]
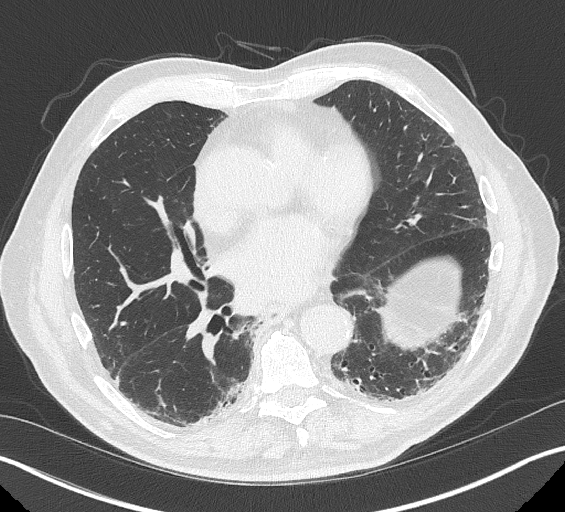
[im 176/327  lung]
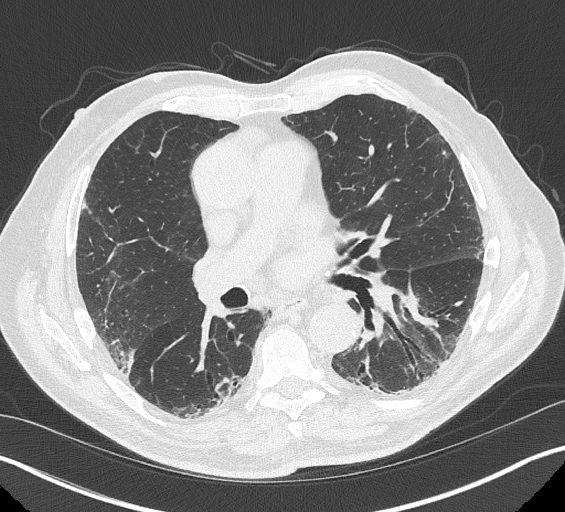
[im 201/327  lung]
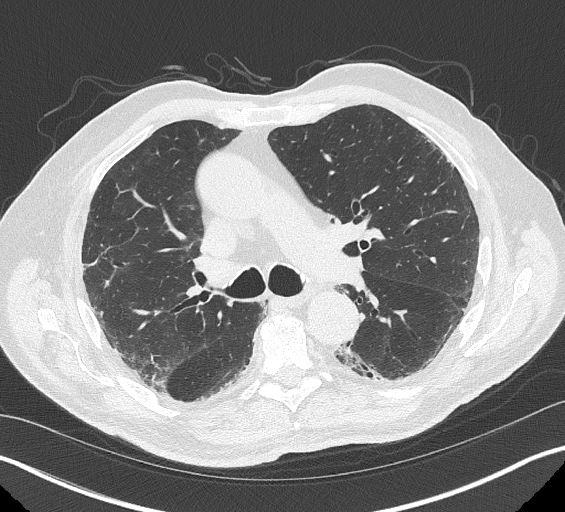
[im 251/327  lung]
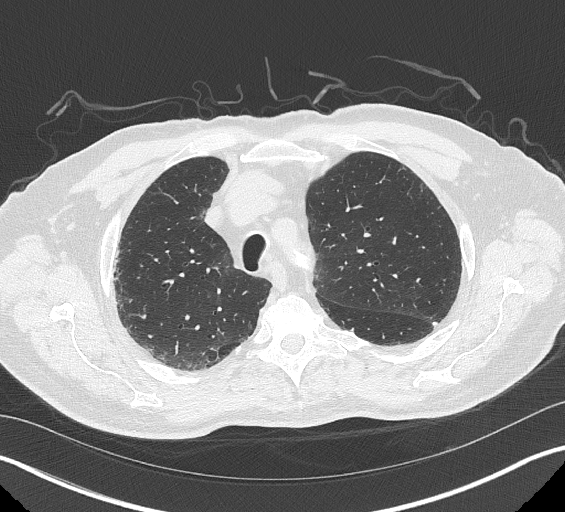
[im 276/327  mediastinal]
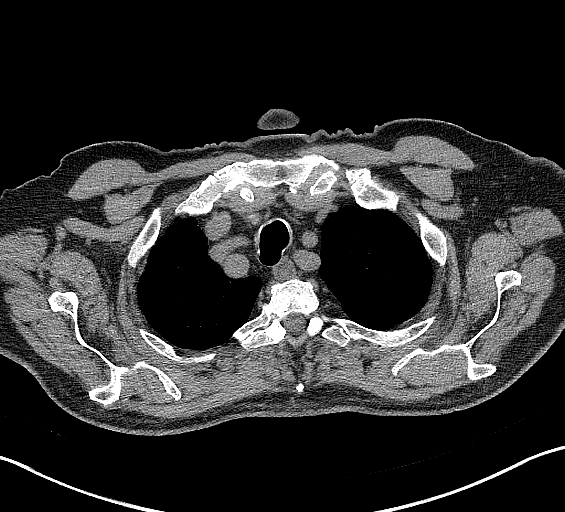
[im 276/327  lung]
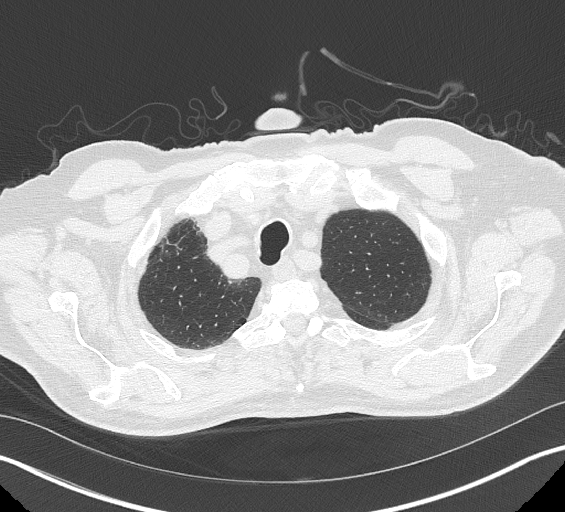
[im 301/327  lung]
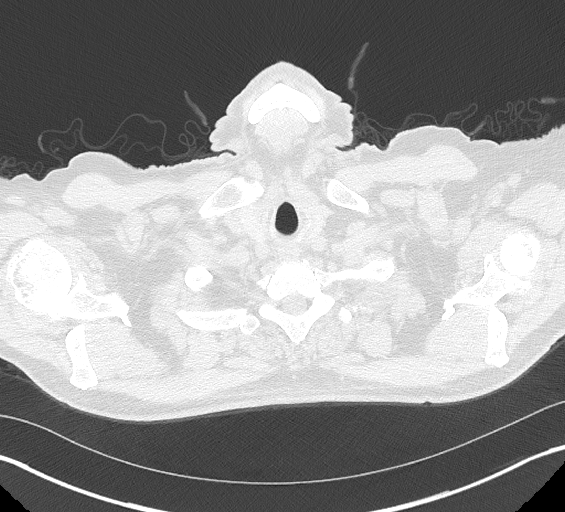

[13 of 36 positions shown; findings below may reference images not displayed]

FINDINGS: Cardiovascular: Heart size is normal. There is no significant
pericardial fluid, thickening or pericardial calcification. There is
aortic atherosclerosis, as well as atherosclerosis of the great
vessels of the mediastinum and the coronary arteries, including
calcified atherosclerotic plaque in the left main, left anterior
descending, left circumflex and right coronary arteries.
Calcifications of the aortic valve and mitral annulus.

Mediastinum/Nodes: No pathologically enlarged mediastinal or hilar
lymph nodes. Please note that accurate exclusion of hilar adenopathy
is limited on noncontrast CT scans. Esophagus is unremarkable in
appearance. No axillary lymphadenopathy.

Lungs/Pleura: High-resolution images again demonstrate some patchy
peripheral predominant ground-glass attenuation, septal thickening,
thickening of the peribronchovascular interstitium, cylindrical and
mild varicose traction bronchiectasis, most evident throughout the
mid to lower lungs bilaterally. No frank honeycombing. Findings
appear mildly progressive compared to prior examinations.
Inspiratory and expiratory imaging demonstrates mild to moderate air
trapping indicative of small airways disease. No acute consolidative
airspace disease. No pleural effusions.

Upper Abdomen: Aortic atherosclerosis. Small calcified gallstone
lying dependently in the gallbladder. Liver has a shrunken
appearance and nodular contour, indicative of cirrhosis. Multiple
low-attenuation lesions in the left kidney, incompletely
characterized on today's noncontrast CT examination, but similar to
prior examinations, statistically likely cysts, measuring up to
cm in the upper pole the left kidney. Colonic diverticulosis noted
in the region of the splenic flexure.

Musculoskeletal: There are no aggressive appearing lytic or blastic
lesions noted in the visualized portions of the skeleton.
IMPRESSION: 1. Spectrum of findings in the lungs compatible with interstitial
lung disease, categorized as probable usual interstitial pneumonia
(UIP) per current ATS guidelines, with mild progression compared to
prior studies.
2. Aortic atherosclerosis, in addition to left main and 3 vessel
coronary artery disease.
3. There are calcifications of the aortic valve and mitral annulus.
Echocardiographic correlation for evaluation of potential valvular
dysfunction may be warranted if clinically indicated.
4. Cholelithiasis without evidence of acute cholecystitis.
5. Cirrhosis.
6. Colonic diverticulosis.

Aortic Atherosclerosis ([id number]).

## 2020-03-21 ENCOUNTER — Telehealth: Payer: Self-pay | Admitting: Cardiovascular Disease

## 2020-03-21 NOTE — Telephone Encounter (Signed)
Patient has been contacted at least 3 times for a recall, recall has been deleted  

## 2024-01-30 DEATH — deceased
